# Patient Record
Sex: Male | Born: 1960 | Race: White | Hispanic: No | Marital: Married | Smoking: Never smoker
Health system: Southern US, Community
[De-identification: ages and names within clinical notes are randomized; demographics above are authoritative.]

## PROBLEM LIST (undated history)

## (undated) DIAGNOSIS — E782 Mixed hyperlipidemia: Secondary | ICD-10-CM

## (undated) DIAGNOSIS — I2109 ST elevation (STEMI) myocardial infarction involving other coronary artery of anterior wall: Principal | ICD-10-CM

## (undated) DIAGNOSIS — K5090 Crohn's disease, unspecified, without complications: Secondary | ICD-10-CM

## (undated) DIAGNOSIS — I2510 Atherosclerotic heart disease of native coronary artery without angina pectoris: Secondary | ICD-10-CM

## (undated) DIAGNOSIS — Z951 Presence of aortocoronary bypass graft: Secondary | ICD-10-CM

## (undated) DIAGNOSIS — Z9289 Personal history of other medical treatment: Secondary | ICD-10-CM

## (undated) HISTORY — PX: HERNIA REPAIR: SHX51

## (undated) HISTORY — DX: Personal history of other medical treatment: Z92.89

---

## 2012-12-21 ENCOUNTER — Encounter: Payer: Self-pay | Admitting: Pharmacist

## 2013-04-20 ENCOUNTER — Encounter: Payer: Self-pay | Admitting: Pharmacist

## 2013-06-19 ENCOUNTER — Encounter: Payer: Self-pay | Admitting: Pharmacist

## 2013-10-15 ENCOUNTER — Encounter (HOSPITAL_COMMUNITY)
Admission: EM | Disposition: A | Discharge status: Home / Self Care | Payer: BC Managed Care – PPO | Source: Home / Self Care | Attending: Thoracic Surgery (Cardiothoracic Vascular Surgery)

## 2013-10-15 ENCOUNTER — Emergency Department (HOSPITAL_BASED_OUTPATIENT_CLINIC_OR_DEPARTMENT_OTHER): Payer: BC Managed Care – PPO

## 2013-10-15 ENCOUNTER — Inpatient Hospital Stay (HOSPITAL_COMMUNITY): Payer: BC Managed Care – PPO

## 2013-10-15 ENCOUNTER — Encounter (HOSPITAL_COMMUNITY)
Admission: EM | Disposition: A | Discharge status: Home / Self Care | Payer: Self-pay | Source: Home / Self Care | Attending: Thoracic Surgery (Cardiothoracic Vascular Surgery)

## 2013-10-15 ENCOUNTER — Encounter (HOSPITAL_COMMUNITY): Payer: BC Managed Care – PPO | Admitting: Anesthesiology

## 2013-10-15 ENCOUNTER — Emergency Department (HOSPITAL_COMMUNITY): Payer: BC Managed Care – PPO

## 2013-10-15 ENCOUNTER — Inpatient Hospital Stay (HOSPITAL_BASED_OUTPATIENT_CLINIC_OR_DEPARTMENT_OTHER)
Admission: EM | Admit: 2013-10-15 | Discharge: 2013-10-19 | DRG: 232 | Disposition: A | Discharge status: Home / Self Care | Payer: BC Managed Care – PPO | Attending: Thoracic Surgery (Cardiothoracic Vascular Surgery) | Admitting: Thoracic Surgery (Cardiothoracic Vascular Surgery)

## 2013-10-15 ENCOUNTER — Ambulatory Visit (HOSPITAL_COMMUNITY): Admit: 2013-10-15 | Payer: Self-pay | Admitting: Interventional Cardiology

## 2013-10-15 ENCOUNTER — Encounter (HOSPITAL_BASED_OUTPATIENT_CLINIC_OR_DEPARTMENT_OTHER): Payer: Self-pay | Admitting: Emergency Medicine

## 2013-10-15 ENCOUNTER — Emergency Department (HOSPITAL_COMMUNITY): Payer: BC Managed Care – PPO | Admitting: Anesthesiology

## 2013-10-15 DIAGNOSIS — D62 Acute posthemorrhagic anemia: Secondary | ICD-10-CM | POA: Diagnosis not present

## 2013-10-15 DIAGNOSIS — Y838 Other surgical procedures as the cause of abnormal reaction of the patient, or of later complication, without mention of misadventure at the time of the procedure: Secondary | ICD-10-CM | POA: Diagnosis not present

## 2013-10-15 DIAGNOSIS — K5090 Crohn's disease, unspecified, without complications: Secondary | ICD-10-CM | POA: Insufficient documentation

## 2013-10-15 DIAGNOSIS — Z9861 Coronary angioplasty status: Secondary | ICD-10-CM

## 2013-10-15 DIAGNOSIS — Y9210 Unspecified residential institution as the place of occurrence of the external cause: Secondary | ICD-10-CM | POA: Diagnosis not present

## 2013-10-15 DIAGNOSIS — Z8249 Family history of ischemic heart disease and other diseases of the circulatory system: Secondary | ICD-10-CM

## 2013-10-15 DIAGNOSIS — E782 Mixed hyperlipidemia: Secondary | ICD-10-CM

## 2013-10-15 DIAGNOSIS — I213 ST elevation (STEMI) myocardial infarction of unspecified site: Secondary | ICD-10-CM

## 2013-10-15 DIAGNOSIS — J9819 Other pulmonary collapse: Secondary | ICD-10-CM | POA: Diagnosis not present

## 2013-10-15 DIAGNOSIS — I2510 Atherosclerotic heart disease of native coronary artery without angina pectoris: Secondary | ICD-10-CM

## 2013-10-15 DIAGNOSIS — Z951 Presence of aortocoronary bypass graft: Secondary | ICD-10-CM

## 2013-10-15 DIAGNOSIS — E119 Type 2 diabetes mellitus without complications: Secondary | ICD-10-CM | POA: Diagnosis present

## 2013-10-15 DIAGNOSIS — I2109 ST elevation (STEMI) myocardial infarction involving other coronary artery of anterior wall: Principal | ICD-10-CM

## 2013-10-15 DIAGNOSIS — J988 Other specified respiratory disorders: Secondary | ICD-10-CM | POA: Diagnosis not present

## 2013-10-15 HISTORY — DX: Presence of aortocoronary bypass graft: Z95.1

## 2013-10-15 HISTORY — DX: Crohn's disease, unspecified, without complications: K50.90

## 2013-10-15 HISTORY — PX: LEFT HEART CATH: SHX5478

## 2013-10-15 HISTORY — DX: ST elevation (STEMI) myocardial infarction involving other coronary artery of anterior wall: I21.09

## 2013-10-15 HISTORY — DX: Mixed hyperlipidemia: E78.2

## 2013-10-15 HISTORY — DX: Atherosclerotic heart disease of native coronary artery without angina pectoris: I25.10

## 2013-10-15 HISTORY — PX: TEE WITHOUT CARDIOVERSION: SHX5443

## 2013-10-15 HISTORY — PX: CORONARY ARTERY BYPASS GRAFT: SHX141

## 2013-10-15 LAB — CBC WITH DIFFERENTIAL/PLATELET
BASOS ABS: 0.1 10*3/uL (ref 0.0–0.1)
Basophils Relative: 1 % (ref 0–1)
EOS ABS: 0.5 10*3/uL (ref 0.0–0.7)
EOS PCT: 4 % (ref 0–5)
HCT: 45.6 % (ref 39.0–52.0)
Hemoglobin: 15.7 g/dL (ref 13.0–17.0)
Lymphocytes Relative: 36 % (ref 12–46)
Lymphs Abs: 4.6 10*3/uL — ABNORMAL HIGH (ref 0.7–4.0)
MCH: 30.7 pg (ref 26.0–34.0)
MCHC: 34.4 g/dL (ref 30.0–36.0)
MCV: 89.2 fL (ref 78.0–100.0)
Monocytes Absolute: 1.7 10*3/uL — ABNORMAL HIGH (ref 0.1–1.0)
Monocytes Relative: 13 % — ABNORMAL HIGH (ref 3–12)
Neutro Abs: 6.2 10*3/uL (ref 1.7–7.7)
Neutrophils Relative %: 47 % (ref 43–77)
Platelets: 356 10*3/uL (ref 150–400)
RBC: 5.11 MIL/uL (ref 4.22–5.81)
RDW: 12.8 % (ref 11.5–15.5)
WBC: 13 10*3/uL — AB (ref 4.0–10.5)

## 2013-10-15 LAB — BASIC METABOLIC PANEL
BUN: 14 mg/dL (ref 6–23)
CALCIUM: 9.8 mg/dL (ref 8.4–10.5)
CO2: 24 meq/L (ref 19–32)
CREATININE: 1.1 mg/dL (ref 0.50–1.35)
Chloride: 100 mEq/L (ref 96–112)
GFR calc Af Amer: 86 mL/min — ABNORMAL LOW (ref >90)
GFR, EST NON AFRICAN AMERICAN: 74 mL/min — AB (ref >90)
Glucose, Bld: 136 mg/dL — ABNORMAL HIGH (ref 70–99)
Potassium: 2.8 mEq/L — CL (ref 3.7–5.3)
Sodium: 140 mEq/L (ref 137–147)

## 2013-10-15 LAB — POCT ACTIVATED CLOTTING TIME
ACTIVATED CLOTTING TIME: 204 s
ACTIVATED CLOTTING TIME: 310 s
Activated Clotting Time: 110 seconds

## 2013-10-15 LAB — GLUCOSE, CAPILLARY
GLUCOSE-CAPILLARY: 92 mg/dL (ref 70–99)
GLUCOSE-CAPILLARY: 141 mg/dL — AB (ref 70–99)
GLUCOSE-CAPILLARY: 140 mg/dL — AB (ref 70–99)
GLUCOSE-CAPILLARY: 133 mg/dL — AB (ref 70–99)
Glucose-Capillary: 105 mg/dL — ABNORMAL HIGH (ref 70–99)
Glucose-Capillary: 105 mg/dL — ABNORMAL HIGH (ref 70–99)
Glucose-Capillary: 105 mg/dL — ABNORMAL HIGH (ref 70–99)
Glucose-Capillary: 119 mg/dL — ABNORMAL HIGH (ref 70–99)
Glucose-Capillary: 127 mg/dL — ABNORMAL HIGH (ref 70–99)
Glucose-Capillary: 162 mg/dL — ABNORMAL HIGH (ref 70–99)

## 2013-10-15 LAB — POCT I-STAT 3, ART BLOOD GAS (G3+)
ACID-BASE DEFICIT: 1 mmol/L (ref 0.0–2.0)
Acid-base deficit: 5 mmol/L — ABNORMAL HIGH (ref 0.0–2.0)
BICARBONATE: 25.5 meq/L — AB (ref 20.0–24.0)
Bicarbonate: 21.9 mEq/L (ref 20.0–24.0)
O2 SAT: 100 %
O2 SAT: 95 %
PCO2 ART: 44.1 mmHg (ref 35.0–45.0)
PH ART: 7.312 — AB (ref 7.350–7.450)
PH ART: 7.301 — AB (ref 7.350–7.450)
PO2 ART: 82 mmHg (ref 80.0–100.0)
Patient temperature: 36.4
TCO2: 27 mmol/L (ref 0–100)
TCO2: 23 mmol/L (ref 0–100)
pCO2 arterial: 50.5 mmHg — ABNORMAL HIGH (ref 35.0–45.0)
pO2, Arterial: 337 mmHg — ABNORMAL HIGH (ref 80.0–100.0)

## 2013-10-15 LAB — CBC
HCT: 32.2 % — ABNORMAL LOW (ref 39.0–52.0)
HEMATOCRIT: 37.3 % — AB (ref 39.0–52.0)
HEMATOCRIT: 34.7 % — AB (ref 39.0–52.0)
HEMOGLOBIN: 11.8 g/dL — AB (ref 13.0–17.0)
Hemoglobin: 12.9 g/dL — ABNORMAL LOW (ref 13.0–17.0)
Hemoglobin: 10.9 g/dL — ABNORMAL LOW (ref 13.0–17.0)
MCH: 30.4 pg (ref 26.0–34.0)
MCH: 29.9 pg (ref 26.0–34.0)
MCH: 30.1 pg (ref 26.0–34.0)
MCHC: 34.6 g/dL (ref 30.0–36.0)
MCHC: 33.9 g/dL (ref 30.0–36.0)
MCHC: 34 g/dL (ref 30.0–36.0)
MCV: 87.8 fL (ref 78.0–100.0)
MCV: 88.5 fL (ref 78.0–100.0)
MCV: 88.5 fL (ref 78.0–100.0)
PLATELETS: 227 10*3/uL (ref 150–400)
Platelets: 265 10*3/uL (ref 150–400)
Platelets: 245 10*3/uL (ref 150–400)
RBC: 4.25 MIL/uL (ref 4.22–5.81)
RBC: 3.64 MIL/uL — AB (ref 4.22–5.81)
RBC: 3.92 MIL/uL — AB (ref 4.22–5.81)
RDW: 12.6 % (ref 11.5–15.5)
RDW: 12.8 % (ref 11.5–15.5)
RDW: 13.1 % (ref 11.5–15.5)
WBC: 12.7 10*3/uL — AB (ref 4.0–10.5)
WBC: 21 10*3/uL — AB (ref 4.0–10.5)
WBC: 25.4 10*3/uL — AB (ref 4.0–10.5)

## 2013-10-15 LAB — POCT I-STAT 4, (NA,K, GLUC, HGB,HCT)
GLUCOSE: 127 mg/dL — AB (ref 70–99)
GLUCOSE: 111 mg/dL — AB (ref 70–99)
Glucose, Bld: 145 mg/dL — ABNORMAL HIGH (ref 70–99)
Glucose, Bld: 108 mg/dL — ABNORMAL HIGH (ref 70–99)
Glucose, Bld: 129 mg/dL — ABNORMAL HIGH (ref 70–99)
Glucose, Bld: 162 mg/dL — ABNORMAL HIGH (ref 70–99)
HCT: 33 % — ABNORMAL LOW (ref 39.0–52.0)
HCT: 26 % — ABNORMAL LOW (ref 39.0–52.0)
HCT: 32 % — ABNORMAL LOW (ref 39.0–52.0)
HEMATOCRIT: 37 % — AB (ref 39.0–52.0)
HEMATOCRIT: 26 % — AB (ref 39.0–52.0)
HEMATOCRIT: 26 % — AB (ref 39.0–52.0)
HEMOGLOBIN: 12.6 g/dL — AB (ref 13.0–17.0)
HEMOGLOBIN: 8.8 g/dL — AB (ref 13.0–17.0)
Hemoglobin: 11.2 g/dL — ABNORMAL LOW (ref 13.0–17.0)
Hemoglobin: 8.8 g/dL — ABNORMAL LOW (ref 13.0–17.0)
Hemoglobin: 8.8 g/dL — ABNORMAL LOW (ref 13.0–17.0)
Hemoglobin: 10.9 g/dL — ABNORMAL LOW (ref 13.0–17.0)
POTASSIUM: 5 meq/L (ref 3.7–5.3)
POTASSIUM: 6 meq/L — AB (ref 3.7–5.3)
Potassium: 4.4 mEq/L (ref 3.7–5.3)
Potassium: 5.1 mEq/L (ref 3.7–5.3)
Potassium: 4.7 mEq/L (ref 3.7–5.3)
Potassium: 4.2 mEq/L (ref 3.7–5.3)
SODIUM: 139 meq/L (ref 137–147)
SODIUM: 133 meq/L — AB (ref 137–147)
SODIUM: 137 meq/L (ref 137–147)
Sodium: 139 mEq/L (ref 137–147)
Sodium: 135 mEq/L — ABNORMAL LOW (ref 137–147)
Sodium: 139 mEq/L (ref 137–147)

## 2013-10-15 LAB — CK TOTAL AND CKMB (NOT AT ARMC)
CK TOTAL: 223 U/L (ref 7–232)
CK, MB: 24.4 ng/mL (ref 0.3–4.0)
RELATIVE INDEX: 10.9 — AB (ref 0.0–2.5)

## 2013-10-15 LAB — COMPREHENSIVE METABOLIC PANEL
ALT: 47 U/L (ref 0–53)
AST: 41 U/L — ABNORMAL HIGH (ref 0–37)
Albumin: 3.2 g/dL — ABNORMAL LOW (ref 3.5–5.2)
Alkaline Phosphatase: 113 U/L (ref 39–117)
BILIRUBIN TOTAL: 0.3 mg/dL (ref 0.3–1.2)
BUN: 14 mg/dL (ref 6–23)
CHLORIDE: 102 meq/L (ref 96–112)
CO2: 20 mEq/L (ref 19–32)
Calcium: 8.5 mg/dL (ref 8.4–10.5)
Creatinine, Ser: 0.97 mg/dL (ref 0.50–1.35)
GLUCOSE: 138 mg/dL — AB (ref 70–99)
Potassium: 4.2 mEq/L (ref 3.7–5.3)
Sodium: 135 mEq/L — ABNORMAL LOW (ref 137–147)
Total Protein: 6 g/dL (ref 6.0–8.3)

## 2013-10-15 LAB — HEMOGLOBIN A1C
Hgb A1c MFr Bld: 6.6 % — ABNORMAL HIGH (ref <5.7)
Mean Plasma Glucose: 143 mg/dL — ABNORMAL HIGH (ref <117)

## 2013-10-15 LAB — PROTIME-INR
INR: 0.85 (ref 0.00–1.49)
INR: 1.14 (ref 0.00–1.49)
INR: 1.26 (ref 0.00–1.49)
PROTHROMBIN TIME: 11.5 s — AB (ref 11.6–15.2)
PROTHROMBIN TIME: 15.5 s — AB (ref 11.6–15.2)
Prothrombin Time: 14.4 seconds (ref 11.6–15.2)

## 2013-10-15 LAB — LIPID PANEL
Cholesterol: 205 mg/dL — ABNORMAL HIGH (ref 0–200)
HDL: 27 mg/dL — ABNORMAL LOW (ref >39)
LDL CALC: 152 mg/dL — AB (ref 0–99)
Total CHOL/HDL Ratio: 7.6 RATIO
Triglycerides: 131 mg/dL (ref <150)
VLDL: 26 mg/dL (ref 0–40)

## 2013-10-15 LAB — CREATININE, SERUM: CREATININE: 0.95 mg/dL (ref 0.50–1.35)

## 2013-10-15 LAB — TROPONIN I: Troponin I: 5.14 ng/mL (ref <0.30)

## 2013-10-15 LAB — MAGNESIUM: Magnesium: 2.5 mg/dL (ref 1.5–2.5)

## 2013-10-15 LAB — APTT: aPTT: 33 seconds (ref 24–37)

## 2013-10-15 LAB — HEMOGLOBIN AND HEMATOCRIT, BLOOD
HCT: 27.2 % — ABNORMAL LOW (ref 39.0–52.0)
Hemoglobin: 9.3 g/dL — ABNORMAL LOW (ref 13.0–17.0)

## 2013-10-15 LAB — MRSA PCR SCREENING: MRSA BY PCR: NEGATIVE

## 2013-10-15 LAB — ABO/RH: ABO/RH(D): O NEG

## 2013-10-15 LAB — PLATELET COUNT: Platelets: 210 10*3/uL (ref 150–400)

## 2013-10-15 SURGERY — CORONARY ARTERY BYPASS GRAFTING (CABG)
Anesthesia: General | Site: Chest

## 2013-10-15 SURGERY — LEFT HEART CATH
Anesthesia: LOCAL

## 2013-10-15 MED ORDER — METOPROLOL TARTRATE 50 MG PO TABS: 25.0000 mg | ORAL_TABLET | Freq: Once | ORAL | Status: DC

## 2013-10-15 MED ORDER — VANCOMYCIN HCL 1000 MG IV SOLR
INTRAVENOUS | Status: DC
  Filled 2013-10-15: qty 1000

## 2013-10-15 MED ORDER — VANCOMYCIN HCL 10 G IV SOLR
1500.0000 mg | INTRAVENOUS | Status: DC
  Filled 2013-10-15: qty 1500

## 2013-10-15 MED ORDER — ONDANSETRON HCL 4 MG/2ML IJ SOLN
4.0000 mg | Freq: Four times a day (QID) | INTRAMUSCULAR | Status: DC | PRN
  Filled 2013-10-15: qty 2

## 2013-10-15 MED ORDER — NITROGLYCERIN IN D5W 200-5 MCG/ML-% IV SOLN
2.0000 ug/min | INTRAVENOUS | Status: DC
  Filled 2013-10-15: qty 250

## 2013-10-15 MED ORDER — SODIUM CHLORIDE 0.9 % IJ SOLN
OROMUCOSAL | Status: DC | PRN
  Administered 2013-10-15 (×5): via TOPICAL

## 2013-10-15 MED ORDER — GI COCKTAIL ~~LOC~~: 30.0000 mL | Freq: Once | ORAL | Status: DC

## 2013-10-15 MED ORDER — DOCUSATE SODIUM 100 MG PO CAPS
200.0000 mg | ORAL_CAPSULE | Freq: Every day | ORAL | Status: DC
Start: 2013-10-16 — End: 2013-10-19
  Administered 2013-10-16 – 2013-10-18 (×3): 200 mg via ORAL
  Filled 2013-10-15 (×4): qty 2

## 2013-10-15 MED ORDER — ASPIRIN 81 MG PO CHEW: 324.0000 mg | CHEWABLE_TABLET | Freq: Every day | ORAL | Status: DC

## 2013-10-15 MED ORDER — PROTAMINE SULFATE 10 MG/ML IV SOLN
INTRAVENOUS | Status: DC | PRN
  Administered 2013-10-15: 320 mg via INTRAVENOUS

## 2013-10-15 MED ORDER — MAGNESIUM SULFATE 4000MG/100ML IJ SOLN
4.0000 g | Freq: Once | INTRAMUSCULAR | Status: AC
Start: 2013-10-15 — End: 2013-10-15
  Administered 2013-10-15: 4 g via INTRAVENOUS
  Filled 2013-10-15: qty 100

## 2013-10-15 MED ORDER — HEPARIN (PORCINE) IN NACL 2-0.9 UNIT/ML-% IJ SOLN
INTRAMUSCULAR | Status: AC
  Filled 2013-10-15: qty 500

## 2013-10-15 MED ORDER — ALBUMIN HUMAN 5 % IV SOLN
INTRAVENOUS | Status: DC | PRN
  Administered 2013-10-15: 12:00:00 via INTRAVENOUS

## 2013-10-15 MED ORDER — SODIUM CHLORIDE 0.9 % IV SOLN
INTRAVENOUS | Status: DC
  Filled 2013-10-15: qty 40

## 2013-10-15 MED ORDER — DEXTROSE 5 % IV SOLN
0.0000 ug/min | INTRAVENOUS | Status: DC
  Administered 2013-10-15: 13.333 ug/min via INTRAVENOUS
  Filled 2013-10-15 (×2): qty 2

## 2013-10-15 MED ORDER — ACETAMINOPHEN 160 MG/5ML PO SOLN
650.0000 mg | Freq: Once | ORAL | Status: AC
Start: 2013-10-15 — End: 2013-10-15

## 2013-10-15 MED ORDER — NITROGLYCERIN IN D5W 200-5 MCG/ML-% IV SOLN
10.0000 ug/min | INTRAVENOUS | Status: DC
  Administered 2013-10-15: 5 ug/min via INTRAVENOUS

## 2013-10-15 MED ORDER — HEPARIN (PORCINE) IN NACL 2-0.9 UNIT/ML-% IJ SOLN
INTRAMUSCULAR | Status: AC
  Filled 2013-10-15: qty 1000

## 2013-10-15 MED ORDER — SODIUM CHLORIDE 0.9 % IV SOLN
250.0000 mL | INTRAVENOUS | Status: AC
  Administered 2013-10-15: 100 mL via INTRAVENOUS

## 2013-10-15 MED ORDER — DEXTROSE 5 % IV SOLN
0.5000 ug/min | INTRAVENOUS | Status: DC
  Filled 2013-10-15: qty 4

## 2013-10-15 MED ORDER — PHENYLEPHRINE HCL 10 MG/ML IJ SOLN
30.0000 ug/min | INTRAVENOUS | Status: DC
  Filled 2013-10-15: qty 2

## 2013-10-15 MED ORDER — BISACODYL 5 MG PO TBEC
10.0000 mg | DELAYED_RELEASE_TABLET | Freq: Every day | ORAL | Status: DC
  Administered 2013-10-16 – 2013-10-18 (×3): 10 mg via ORAL
  Filled 2013-10-15 (×3): qty 2

## 2013-10-15 MED ORDER — DEXTROSE 5 % IV SOLN
1.5000 g | Freq: Two times a day (BID) | INTRAVENOUS | Status: AC
  Administered 2013-10-15 – 2013-10-17 (×4): 1.5 g via INTRAVENOUS
  Filled 2013-10-15 (×4): qty 1.5

## 2013-10-15 MED ORDER — FENTANYL CITRATE 0.05 MG/ML IJ SOLN
INTRAMUSCULAR | Status: DC | PRN
  Administered 2013-10-15 (×2): 250 ug via INTRAVENOUS
  Administered 2013-10-15: 600 ug via INTRAVENOUS
  Administered 2013-10-15: 50 ug via INTRAVENOUS
  Administered 2013-10-15: 125 ug via INTRAVENOUS
  Administered 2013-10-15: 100 ug via INTRAVENOUS
  Administered 2013-10-15: 125 ug via INTRAVENOUS

## 2013-10-15 MED ORDER — LACTATED RINGERS IV SOLN
INTRAVENOUS | Status: DC | PRN
  Administered 2013-10-15 (×2): via INTRAVENOUS

## 2013-10-15 MED ORDER — LIDOCAINE HCL (PF) 1 % IJ SOLN
INTRAMUSCULAR | Status: AC
  Filled 2013-10-15: qty 30

## 2013-10-15 MED ORDER — ASPIRIN 81 MG PO CHEW
324.0000 mg | CHEWABLE_TABLET | Freq: Once | ORAL | Status: AC
  Administered 2013-10-15: 324 mg via ORAL
  Filled 2013-10-15: qty 4

## 2013-10-15 MED ORDER — MIDAZOLAM HCL 5 MG/5ML IJ SOLN
INTRAMUSCULAR | Status: DC | PRN
  Administered 2013-10-15: 3 mg via INTRAVENOUS
  Administered 2013-10-15: 1 mg via INTRAVENOUS
  Administered 2013-10-15: 2 mg via INTRAVENOUS

## 2013-10-15 MED ORDER — MIDAZOLAM HCL 2 MG/2ML IJ SOLN: 2.0000 mg | INTRAMUSCULAR | Status: DC | PRN

## 2013-10-15 MED ORDER — SODIUM CHLORIDE 0.9 % IV SOLN
INTRAVENOUS | Status: DC
  Filled 2013-10-15: qty 30

## 2013-10-15 MED ORDER — LACTATED RINGERS IV SOLN: INTRAVENOUS | Status: DC

## 2013-10-15 MED ORDER — SODIUM CHLORIDE 0.9 % IV SOLN
100.0000 [IU] | INTRAVENOUS | Status: DC | PRN
  Administered 2013-10-15: 2.6 [IU]/h via INTRAVENOUS

## 2013-10-15 MED ORDER — SODIUM CHLORIDE 0.9 % IV SOLN
INTRAVENOUS | Status: DC
  Filled 2013-10-15: qty 1

## 2013-10-15 MED ORDER — ACETAMINOPHEN 650 MG RE SUPP
650.0000 mg | Freq: Once | RECTAL | Status: AC
  Administered 2013-10-15: 650 mg via RECTAL

## 2013-10-15 MED ORDER — FAMOTIDINE IN NACL 20-0.9 MG/50ML-% IV SOLN
20.0000 mg | Freq: Two times a day (BID) | INTRAVENOUS | Status: AC
  Administered 2013-10-15 (×2): 20 mg via INTRAVENOUS
  Filled 2013-10-15: qty 50

## 2013-10-15 MED ORDER — ACETAMINOPHEN 500 MG PO TABS
1000.0000 mg | ORAL_TABLET | Freq: Four times a day (QID) | ORAL | Status: DC
  Administered 2013-10-15 – 2013-10-19 (×15): 1000 mg via ORAL
  Filled 2013-10-15 (×17): qty 2

## 2013-10-15 MED ORDER — SODIUM CHLORIDE 0.9 % IV SOLN
1000.0000 mg | INTRAVENOUS | Status: DC | PRN
  Administered 2013-10-15: 1500 mg via INTRAVENOUS

## 2013-10-15 MED ORDER — 0.9 % SODIUM CHLORIDE (POUR BTL) OPTIME
TOPICAL | Status: DC | PRN
  Administered 2013-10-15: 7000 mL

## 2013-10-15 MED ORDER — HEPARIN SODIUM (PORCINE) 5000 UNIT/ML IJ SOLN
INTRAMUSCULAR | Status: AC
  Administered 2013-10-15: 37000 [IU] via SUBCUTANEOUS
  Filled 2013-10-15: qty 1

## 2013-10-15 MED ORDER — NITROGLYCERIN IN D5W 200-5 MCG/ML-% IV SOLN
INTRAVENOUS | Status: AC
  Filled 2013-10-15: qty 250

## 2013-10-15 MED ORDER — POTASSIUM CHLORIDE 2 MEQ/ML IV SOLN
80.0000 meq | INTRAVENOUS | Status: DC
  Filled 2013-10-15: qty 40

## 2013-10-15 MED ORDER — PLASMA-LYTE 148 IV SOLN
INTRAVENOUS | Status: DC | PRN
  Administered 2013-10-15: 08:00:00 via INTRAVASCULAR

## 2013-10-15 MED ORDER — DEXTROSE 5 % IV SOLN
750.0000 mg | INTRAVENOUS | Status: DC
  Filled 2013-10-15: qty 750

## 2013-10-15 MED ORDER — SODIUM CHLORIDE 0.9 % IV SOLN: 1000.0000 mL | INTRAVENOUS | Status: DC

## 2013-10-15 MED ORDER — DEXTROSE 5 % IV SOLN
1.5000 g | INTRAVENOUS | Status: DC
  Filled 2013-10-15: qty 1.5

## 2013-10-15 MED ORDER — LACTATED RINGERS IV SOLN
INTRAVENOUS | Status: DC | PRN
  Administered 2013-10-15: 07:00:00 via INTRAVENOUS

## 2013-10-15 MED ORDER — MORPHINE SULFATE 2 MG/ML IJ SOLN
1.0000 mg | INTRAMUSCULAR | Status: AC | PRN
  Administered 2013-10-15: 2 mg via INTRAVENOUS
  Filled 2013-10-15: qty 1

## 2013-10-15 MED ORDER — MIDAZOLAM HCL 2 MG/2ML IJ SOLN
INTRAMUSCULAR | Status: AC
Start: 2013-10-15 — End: 2013-10-15
  Filled 2013-10-15: qty 2

## 2013-10-15 MED ORDER — ROCURONIUM BROMIDE 100 MG/10ML IV SOLN
INTRAVENOUS | Status: DC | PRN
  Administered 2013-10-15: 50 mg via INTRAVENOUS

## 2013-10-15 MED ORDER — NITROGLYCERIN 0.2 MG/ML ON CALL CATH LAB
INTRAVENOUS | Status: AC
  Filled 2013-10-15: qty 1

## 2013-10-15 MED ORDER — SODIUM CHLORIDE 0.9 % IV SOLN
INTRAVENOUS | Status: DC
  Administered 2013-10-16: 1.4 [IU]/h via INTRAVENOUS
  Filled 2013-10-15 (×2): qty 1

## 2013-10-15 MED ORDER — MAGNESIUM SULFATE 50 % IJ SOLN
40.0000 meq | INTRAMUSCULAR | Status: DC
  Filled 2013-10-15: qty 10

## 2013-10-15 MED ORDER — HEPARIN BOLUS VIA INFUSION: 4000.0000 [IU] | Freq: Once | INTRAVENOUS | Status: DC

## 2013-10-15 MED ORDER — PLASMA-LYTE 148 IV SOLN
INTRAVENOUS | Status: DC
  Filled 2013-10-15: qty 2.5

## 2013-10-15 MED ORDER — LACTATED RINGERS IV SOLN: 500.0000 mL | Freq: Once | INTRAVENOUS | Status: AC | PRN

## 2013-10-15 MED ORDER — SODIUM CHLORIDE 0.9 % IV SOLN
10.0000 g | INTRAVENOUS | Status: DC | PRN
  Administered 2013-10-15: 5 g/h via INTRAVENOUS

## 2013-10-15 MED ORDER — SODIUM CHLORIDE 0.9 % IV SOLN
INTRAVENOUS | Status: DC | PRN
  Administered 2013-10-15: 11:00:00 via INTRAVENOUS

## 2013-10-15 MED ORDER — TIROFIBAN HCL IV 5 MG/100ML
INTRAVENOUS | Status: AC
  Filled 2013-10-15: qty 100

## 2013-10-15 MED ORDER — HEPARIN SODIUM (PORCINE) 5000 UNIT/ML IJ SOLN: 60.0000 [IU]/kg | Freq: Once | INTRAMUSCULAR | Status: DC

## 2013-10-15 MED ORDER — CALCIUM CHLORIDE 10 % IV SOLN
INTRAVENOUS | Status: DC | PRN
  Administered 2013-10-15: 1 g via INTRAVENOUS

## 2013-10-15 MED ORDER — SUCCINYLCHOLINE CHLORIDE 20 MG/ML IJ SOLN
INTRAMUSCULAR | Status: DC | PRN
  Administered 2013-10-15: 140 mg via INTRAVENOUS

## 2013-10-15 MED ORDER — HEPARIN SODIUM (PORCINE) 1000 UNIT/ML IJ SOLN
INTRAMUSCULAR | Status: AC
Start: 2013-10-15 — End: 2013-10-15
  Filled 2013-10-15: qty 1

## 2013-10-15 MED ORDER — SODIUM CHLORIDE 0.9 % IV SOLN: 1000.0000 mL | Freq: Once | INTRAVENOUS | Status: DC

## 2013-10-15 MED ORDER — CEFUROXIME SODIUM 750 MG IJ SOLR
INTRAMUSCULAR | Status: DC | PRN
  Administered 2013-10-15: 750 mg via INTRAMUSCULAR

## 2013-10-15 MED ORDER — OXYCODONE HCL 5 MG PO TABS
5.0000 mg | ORAL_TABLET | ORAL | Status: DC | PRN
  Administered 2013-10-16 – 2013-10-17 (×4): 5 mg via ORAL
  Filled 2013-10-15 (×4): qty 1

## 2013-10-15 MED ORDER — HEPARIN SODIUM (PORCINE) 1000 UNIT/ML IJ SOLN
INTRAMUSCULAR | Status: DC | PRN
  Administered 2013-10-15: 3000 [IU] via INTRAVENOUS

## 2013-10-15 MED ORDER — DEXMEDETOMIDINE HCL IN NACL 200 MCG/50ML IV SOLN
INTRAVENOUS | Status: DC | PRN
  Administered 2013-10-15: 0.7 ug/kg/h via INTRAVENOUS

## 2013-10-15 MED ORDER — HEMOSTATIC AGENTS (NO CHARGE) OPTIME
TOPICAL | Status: DC | PRN
  Administered 2013-10-15: 1 via TOPICAL

## 2013-10-15 MED ORDER — ASPIRIN EC 325 MG PO TBEC
325.0000 mg | DELAYED_RELEASE_TABLET | Freq: Every day | ORAL | Status: DC
  Administered 2013-10-16 – 2013-10-19 (×4): 325 mg via ORAL
  Filled 2013-10-15 (×4): qty 1

## 2013-10-15 MED ORDER — VANCOMYCIN HCL IN DEXTROSE 1-5 GM/200ML-% IV SOLN
1000.0000 mg | Freq: Once | INTRAVENOUS | Status: AC
  Administered 2013-10-15: 1000 mg via INTRAVENOUS
  Filled 2013-10-15: qty 200

## 2013-10-15 MED ORDER — DEXMEDETOMIDINE HCL IN NACL 200 MCG/50ML IV SOLN
0.1000 ug/kg/h | INTRAVENOUS | Status: DC
Start: 2013-10-15 — End: 2013-10-16
  Administered 2013-10-15: 0.028 ug/kg/h via INTRAVENOUS
  Filled 2013-10-15: qty 50

## 2013-10-15 MED ORDER — NITROGLYCERIN 0.4 MG SL SUBL
SUBLINGUAL_TABLET | SUBLINGUAL | Status: AC
  Filled 2013-10-15: qty 1

## 2013-10-15 MED ORDER — DOPAMINE-DEXTROSE 3.2-5 MG/ML-% IV SOLN
2.0000 ug/kg/min | INTRAVENOUS | Status: DC
  Filled 2013-10-15: qty 250

## 2013-10-15 MED ORDER — ACETAMINOPHEN 160 MG/5ML PO SOLN: 1000.0000 mg | Freq: Four times a day (QID) | ORAL | Status: DC

## 2013-10-15 MED ORDER — SODIUM CHLORIDE 0.45 % IV SOLN
INTRAVENOUS | Status: DC
  Administered 2013-10-15: 13:00:00 via INTRAVENOUS

## 2013-10-15 MED ORDER — SODIUM CHLORIDE 0.9 % IJ SOLN
3.0000 mL | Freq: Two times a day (BID) | INTRAMUSCULAR | Status: DC
  Administered 2013-10-16 – 2013-10-17 (×2): 3 mL via INTRAVENOUS

## 2013-10-15 MED ORDER — VERAPAMIL HCL 2.5 MG/ML IV SOLN
INTRAVENOUS | Status: AC
  Filled 2013-10-15: qty 2

## 2013-10-15 MED ORDER — METOPROLOL TARTRATE 1 MG/ML IV SOLN: 2.5000 mg | INTRAVENOUS | Status: DC | PRN

## 2013-10-15 MED ORDER — VANCOMYCIN HCL 1000 MG IV SOLR
INTRAVENOUS | Status: DC | PRN
  Administered 2013-10-15: 08:00:00

## 2013-10-15 MED ORDER — NITROGLYCERIN 0.4 MG SL SUBL
0.4000 mg | SUBLINGUAL_TABLET | SUBLINGUAL | Status: DC | PRN
  Administered 2013-10-15: 0.4 mg via SUBLINGUAL
  Filled 2013-10-15: qty 25

## 2013-10-15 MED ORDER — INSULIN REGULAR BOLUS VIA INFUSION
0.0000 [IU] | Freq: Three times a day (TID) | INTRAVENOUS | Status: DC
Start: 2013-10-15 — End: 2013-10-18
  Filled 2013-10-15: qty 10

## 2013-10-15 MED ORDER — BISACODYL 10 MG RE SUPP: 10.0000 mg | Freq: Every day | RECTAL | Status: DC

## 2013-10-15 MED ORDER — FENTANYL CITRATE 0.05 MG/ML IJ SOLN
INTRAMUSCULAR | Status: AC
  Filled 2013-10-15: qty 2

## 2013-10-15 MED ORDER — NITROGLYCERIN 0.4 MG/SPRAY TL SOLN
Status: AC
  Filled 2013-10-15: qty 4.9

## 2013-10-15 MED ORDER — POTASSIUM CHLORIDE 10 MEQ/50ML IV SOLN: 10.0000 meq | INTRAVENOUS | Status: AC

## 2013-10-15 MED ORDER — METOPROLOL TARTRATE 25 MG/10 ML ORAL SUSPENSION
12.5000 mg | Freq: Two times a day (BID) | ORAL | Status: DC
  Filled 2013-10-15 (×3): qty 5

## 2013-10-15 MED ORDER — ATORVASTATIN CALCIUM 40 MG PO TABS
40.0000 mg | ORAL_TABLET | Freq: Every day | ORAL | Status: DC
  Filled 2013-10-15 (×2): qty 1

## 2013-10-15 MED ORDER — MORPHINE SULFATE 2 MG/ML IJ SOLN
2.0000 mg | INTRAMUSCULAR | Status: DC | PRN
Start: 2013-10-15 — End: 2013-10-16
  Administered 2013-10-15 (×3): 2 mg via INTRAVENOUS
  Administered 2013-10-16 (×3): 4 mg via INTRAVENOUS
  Filled 2013-10-15: qty 1
  Filled 2013-10-15 (×2): qty 2
  Filled 2013-10-15: qty 1
  Filled 2013-10-15: qty 2
  Filled 2013-10-15: qty 1

## 2013-10-15 MED ORDER — SODIUM CHLORIDE 0.9 % IV SOLN: INTRAVENOUS | Status: DC

## 2013-10-15 MED ORDER — PROPOFOL 10 MG/ML IV BOLUS
INTRAVENOUS | Status: DC | PRN
  Administered 2013-10-15: 50 mg via INTRAVENOUS

## 2013-10-15 MED ORDER — METOPROLOL TARTRATE 1 MG/ML IV SOLN
INTRAVENOUS | Status: AC
  Filled 2013-10-15: qty 5

## 2013-10-15 MED ORDER — VECURONIUM BROMIDE 10 MG IV SOLR
INTRAVENOUS | Status: DC | PRN
  Administered 2013-10-15: 4 mg via INTRAVENOUS
  Administered 2013-10-15: 6 mg via INTRAVENOUS
  Administered 2013-10-15: 3 mg via INTRAVENOUS

## 2013-10-15 MED ORDER — PANTOPRAZOLE SODIUM 40 MG PO TBEC
40.0000 mg | DELAYED_RELEASE_TABLET | Freq: Every day | ORAL | Status: DC
  Administered 2013-10-17 – 2013-10-18 (×2): 40 mg via ORAL
  Filled 2013-10-15 (×3): qty 1

## 2013-10-15 MED ORDER — METOPROLOL TARTRATE 12.5 MG HALF TABLET
12.5000 mg | ORAL_TABLET | Freq: Two times a day (BID) | ORAL | Status: DC
  Administered 2013-10-16 – 2013-10-19 (×6): 12.5 mg via ORAL
  Filled 2013-10-15 (×9): qty 1

## 2013-10-15 MED ORDER — NITROGLYCERIN IN D5W 200-5 MCG/ML-% IV SOLN: 0.0000 ug/min | INTRAVENOUS | Status: DC

## 2013-10-15 MED ORDER — DEXTROSE 5 % IV SOLN
1.5000 g | INTRAVENOUS | Status: DC | PRN
  Administered 2013-10-15: 1.5 g via INTRAVENOUS

## 2013-10-15 MED ORDER — PHENYLEPHRINE HCL 10 MG/ML IJ SOLN
20.0000 mg | INTRAVENOUS | Status: DC | PRN
  Administered 2013-10-15: 40 ug/min via INTRAVENOUS

## 2013-10-15 MED ORDER — SODIUM CHLORIDE 0.9 % IJ SOLN: 3.0000 mL | INTRAMUSCULAR | Status: DC | PRN

## 2013-10-15 MED ORDER — METOPROLOL TARTRATE 1 MG/ML IV SOLN
5.0000 mg | Freq: Once | INTRAVENOUS | Status: AC
  Administered 2013-10-15: 5 mg via INTRAVENOUS

## 2013-10-15 MED ORDER — ATROPINE SULFATE 1 MG/ML IJ SOLN
INTRAMUSCULAR | Status: DC | PRN
  Administered 2013-10-15: 0.4 mg via INTRAVENOUS

## 2013-10-15 MED ORDER — DEXMEDETOMIDINE HCL IN NACL 400 MCG/100ML IV SOLN
0.1000 ug/kg/h | INTRAVENOUS | Status: DC
  Filled 2013-10-15: qty 100

## 2013-10-15 MED ORDER — HEPARIN (PORCINE) IN NACL 100-0.45 UNIT/ML-% IJ SOLN: 1200.0000 [IU]/h | INTRAMUSCULAR | Status: DC

## 2013-10-15 MED ORDER — ALBUMIN HUMAN 5 % IV SOLN
250.0000 mL | INTRAVENOUS | Status: AC | PRN
  Administered 2013-10-15 (×2): 250 mL via INTRAVENOUS

## 2013-10-15 SURGICAL SUPPLY — 119 items
ADAPTER CARDIO PERF ANTE/RETRO (ADAPTER) IMPLANT
APPLIER CLIP 9.375 MED OPEN (MISCELLANEOUS)
APPLIER CLIP 9.375 SM OPEN (CLIP)
ATTRACTOMAT 16X20 MAGNETIC DRP (DRAPES) ×3 IMPLANT
BAG DECANTER FOR FLEXI CONT (MISCELLANEOUS) ×3 IMPLANT
BANDAGE ELASTIC 4 VELCRO ST LF (GAUZE/BANDAGES/DRESSINGS) ×6 IMPLANT
BANDAGE ELASTIC 6 VELCRO ST LF (GAUZE/BANDAGES/DRESSINGS) ×6 IMPLANT
BANDAGE GAUZE ELAST BULKY 4 IN (GAUZE/BANDAGES/DRESSINGS) ×6 IMPLANT
BASKET HEART (ORDER IN 25'S) (MISCELLANEOUS) ×1
BASKET HEART (ORDER IN 25S) (MISCELLANEOUS) ×2 IMPLANT
BENZOIN TINCTURE PRP APPL 2/3 (GAUZE/BANDAGES/DRESSINGS) ×3 IMPLANT
BLADE STERNUM SYSTEM 6 (BLADE) ×3 IMPLANT
BLADE SURG ROTATE 9660 (MISCELLANEOUS) ×3 IMPLANT
CANISTER SUCTION 2500CC (MISCELLANEOUS) ×3 IMPLANT
CANNULA EZ GLIDE AORTIC 21FR (CANNULA) ×3 IMPLANT
CANNULA GUNDRY RCSP 15FR (MISCELLANEOUS) ×3 IMPLANT
CANNULA VENOUS LOW PROF 34X46 (CANNULA) ×3 IMPLANT
CARDIAC SUCTION (MISCELLANEOUS) ×3 IMPLANT
CATH CPB KIT OWEN (MISCELLANEOUS) ×3 IMPLANT
CATH THORACIC 28FR (CATHETERS) IMPLANT
CATH THORACIC 28FR RT ANG (CATHETERS) IMPLANT
CATH THORACIC 36FR (CATHETERS) ×3 IMPLANT
CATH THORACIC 36FR RT ANG (CATHETERS) ×3 IMPLANT
CLIP APPLIE 9.375 MED OPEN (MISCELLANEOUS) IMPLANT
CLIP APPLIE 9.375 SM OPEN (CLIP) IMPLANT
CLIP FOGARTY SPRING 6M (CLIP) IMPLANT
CLIP RETRACTION 3.0MM CORONARY (MISCELLANEOUS) ×3 IMPLANT
CLIP TI MEDIUM 24 (CLIP) IMPLANT
CLIP TI WIDE RED SMALL 24 (CLIP) ×3 IMPLANT
CONN Y 3/8X3/8X3/8  BEN (MISCELLANEOUS)
CONN Y 3/8X3/8X3/8 BEN (MISCELLANEOUS) IMPLANT
COVER SURGICAL LIGHT HANDLE (MISCELLANEOUS) ×3 IMPLANT
CRADLE DONUT ADULT HEAD (MISCELLANEOUS) ×3 IMPLANT
DERMABOND ADVANCED (GAUZE/BANDAGES/DRESSINGS) ×1
DERMABOND ADVANCED .7 DNX12 (GAUZE/BANDAGES/DRESSINGS) ×2 IMPLANT
DRAIN CHANNEL 32F RND 10.7 FF (WOUND CARE) ×3 IMPLANT
DRAPE CARDIOVASCULAR INCISE (DRAPES) ×1
DRAPE SLUSH/WARMER DISC (DRAPES) ×3 IMPLANT
DRAPE SRG 135X102X78XABS (DRAPES) ×2 IMPLANT
DRSG COVADERM 4X14 (GAUZE/BANDAGES/DRESSINGS) ×3 IMPLANT
ELECT REM PT RETURN 9FT ADLT (ELECTROSURGICAL) ×6
ELECTRODE REM PT RTRN 9FT ADLT (ELECTROSURGICAL) ×4 IMPLANT
GLOVE BIO SURGEON STRL SZ 6 (GLOVE) ×9 IMPLANT
GLOVE BIO SURGEON STRL SZ 6.5 (GLOVE) ×9 IMPLANT
GLOVE BIO SURGEON STRL SZ7 (GLOVE) IMPLANT
GLOVE BIO SURGEON STRL SZ7.5 (GLOVE) ×6 IMPLANT
GLOVE BIOGEL PI IND STRL 6 (GLOVE) ×2 IMPLANT
GLOVE BIOGEL PI IND STRL 6.5 (GLOVE) IMPLANT
GLOVE BIOGEL PI IND STRL 7.0 (GLOVE) ×10 IMPLANT
GLOVE BIOGEL PI INDICATOR 6 (GLOVE) ×1
GLOVE BIOGEL PI INDICATOR 6.5 (GLOVE)
GLOVE BIOGEL PI INDICATOR 7.0 (GLOVE) ×5
GLOVE EUDERMIC 7 POWDERFREE (GLOVE) IMPLANT
GLOVE ORTHO TXT STRL SZ7.5 (GLOVE) ×6 IMPLANT
GOWN STRL REUS W/ TWL LRG LVL3 (GOWN DISPOSABLE) ×20 IMPLANT
GOWN STRL REUS W/TWL LRG LVL3 (GOWN DISPOSABLE) ×10
HEMOSTAT POWDER SURGIFOAM 1G (HEMOSTASIS) ×9 IMPLANT
INSERT FOGARTY 61MM (MISCELLANEOUS) IMPLANT
INSERT FOGARTY XLG (MISCELLANEOUS) ×3 IMPLANT
KIT BASIN OR (CUSTOM PROCEDURE TRAY) ×3 IMPLANT
KIT ROOM TURNOVER OR (KITS) ×3 IMPLANT
KIT SUCTION CATH 14FR (SUCTIONS) ×15 IMPLANT
KIT VASOVIEW W/TROCAR VH 2000 (KITS) ×3 IMPLANT
LEAD PACING MYOCARDI (MISCELLANEOUS) ×3 IMPLANT
MARKER GRAFT CORONARY BYPASS (MISCELLANEOUS) ×9 IMPLANT
NS IRRIG 1000ML POUR BTL (IV SOLUTION) ×15 IMPLANT
PACK OPEN HEART (CUSTOM PROCEDURE TRAY) ×3 IMPLANT
PAD ARMBOARD 7.5X6 YLW CONV (MISCELLANEOUS) ×3 IMPLANT
PAD ELECT DEFIB RADIOL ZOLL (MISCELLANEOUS) ×3 IMPLANT
PENCIL BUTTON HOLSTER BLD 10FT (ELECTRODE) ×3 IMPLANT
PUNCH AORTIC ROT 4.0MM RCL 40 (MISCELLANEOUS) ×3 IMPLANT
PUNCH AORTIC ROTATE 4.0MM (MISCELLANEOUS) IMPLANT
PUNCH AORTIC ROTATE 4.5MM 8IN (MISCELLANEOUS) IMPLANT
PUNCH AORTIC ROTATE 5MM 8IN (MISCELLANEOUS) IMPLANT
SET CARDIOPLEGIA MPS 5001102 (MISCELLANEOUS) ×6 IMPLANT
SOLUTION ANTI FOG 6CC (MISCELLANEOUS) IMPLANT
SPONGE GAUZE 4X4 12PLY (GAUZE/BANDAGES/DRESSINGS) ×9 IMPLANT
SPONGE LAP 18X18 X RAY DECT (DISPOSABLE) ×3 IMPLANT
SPONGE LAP 4X18 X RAY DECT (DISPOSABLE) IMPLANT
SUT BONE WAX W31G (SUTURE) ×3 IMPLANT
SUT ETHIBOND X763 2 0 SH 1 (SUTURE) ×6 IMPLANT
SUT MNCRL AB 3-0 PS2 18 (SUTURE) ×6 IMPLANT
SUT MNCRL AB 4-0 PS2 18 (SUTURE) IMPLANT
SUT PDS AB 1 CTX 36 (SUTURE) ×6 IMPLANT
SUT PROLENE 2 0 SH DA (SUTURE) IMPLANT
SUT PROLENE 3 0 SH DA (SUTURE) ×3 IMPLANT
SUT PROLENE 3 0 SH1 36 (SUTURE) IMPLANT
SUT PROLENE 4 0 RB 1 (SUTURE) ×2
SUT PROLENE 4 0 SH DA (SUTURE) ×3 IMPLANT
SUT PROLENE 4-0 RB1 .5 CRCL 36 (SUTURE) ×4 IMPLANT
SUT PROLENE 5 0 C 1 36 (SUTURE) IMPLANT
SUT PROLENE 6 0 C 1 30 (SUTURE) IMPLANT
SUT PROLENE 7 0 BV1 MDA (SUTURE) ×3 IMPLANT
SUT PROLENE 7.0 RB 3 (SUTURE) ×15 IMPLANT
SUT PROLENE 8 0 BV175 6 (SUTURE) ×6 IMPLANT
SUT PROLENE BLUE 7 0 (SUTURE) ×6 IMPLANT
SUT PROLENE POLY MONO (SUTURE) ×3 IMPLANT
SUT SILK  1 MH (SUTURE) ×2
SUT SILK 1 MH (SUTURE) ×4 IMPLANT
SUT STEEL 6MS V (SUTURE) IMPLANT
SUT STEEL STERNAL CCS#1 18IN (SUTURE) ×3 IMPLANT
SUT STEEL SZ 6 DBL 3X14 BALL (SUTURE) ×6 IMPLANT
SUT VIC AB 1 CTX 36 (SUTURE)
SUT VIC AB 1 CTX36XBRD ANBCTR (SUTURE) IMPLANT
SUT VIC AB 2-0 CT1 27 (SUTURE) ×1
SUT VIC AB 2-0 CT1 TAPERPNT 27 (SUTURE) ×2 IMPLANT
SUT VIC AB 2-0 CTX 27 (SUTURE) IMPLANT
SUT VIC AB 3-0 SH 27 (SUTURE)
SUT VIC AB 3-0 SH 27X BRD (SUTURE) IMPLANT
SUT VIC AB 3-0 X1 27 (SUTURE) ×6 IMPLANT
SUT VICRYL 4-0 PS2 18IN ABS (SUTURE) IMPLANT
SUTURE E-PAK OPEN HEART (SUTURE) ×3 IMPLANT
SYSTEM SAHARA CHEST DRAIN ATS (WOUND CARE) ×3 IMPLANT
TOWEL OR 17X24 6PK STRL BLUE (TOWEL DISPOSABLE) ×6 IMPLANT
TOWEL OR 17X26 10 PK STRL BLUE (TOWEL DISPOSABLE) ×6 IMPLANT
TRAY FOLEY IC TEMP SENS 16FR (CATHETERS) ×3 IMPLANT
TUBING INSUFFLATION 10FT LAP (TUBING) ×3 IMPLANT
UNDERPAD 30X30 INCONTINENT (UNDERPADS AND DIAPERS) ×3 IMPLANT
WATER STERILE IRR 1000ML POUR (IV SOLUTION) ×6 IMPLANT

## 2013-10-15 NOTE — Anesthesia Preprocedure Evaluation (Addendum)
Anesthesia Evaluation  Patient identified by MRN, date of birth, ID band Patient awake    Reviewed: Allergy & Precautions, H&P , NPO status , Patient's Chart, lab work & pertinent test results, reviewed documented beta blocker date and time   Airway Mallampati: I TM Distance: >3 FB Neck ROM: Full    Dental no notable dental hx.    Pulmonary neg pulmonary ROS,    Pulmonary exam normal       Cardiovascular + angina at rest + CAD and + Past MI negative cardio ROS   STEMI   Neuro/Psych negative neurological ROS  negative psych ROS   GI/Hepatic negative GI ROS, Neg liver ROS, GERD-  Medicated and Controlled,  Endo/Other  negative endocrine ROS  Renal/GU negative Renal ROS  negative genitourinary   Musculoskeletal   Abdominal   Peds  Hematology negative hematology ROS (+)   Anesthesia Other Findings   Reproductive/Obstetrics negative OB ROS                         Anesthesia Physical Anesthesia Plan  ASA: IV and emergent  Anesthesia Plan: General   Post-op Pain Management:    Induction: Intravenous  Airway Management Planned: Oral ETT  Additional Equipment: Arterial line, CVP, PA Cath, TEE and Ultrasound Guidance Line Placement  Intra-op Plan:   Post-operative Plan: Post-operative intubation/ventilation  Informed Consent: I have reviewed the patients History and Physical, chart, labs and discussed the procedure including the risks, benefits and alternatives for the proposed anesthesia with the patient or authorized representative who has indicated his/her understanding and acceptance.   Dental advisory given  Plan Discussed with: CRNA  Anesthesia Plan Comments:        Anesthesia Quick Evaluation

## 2013-10-15 NOTE — Brief Op Note (Addendum)
      [address]       [name] [address]             [phone]     10/15/2013  11:04 AM  PATIENT:  Kenneth Humphrey  53 y.o. male  PRE-OPERATIVE DIAGNOSIS:  acute myocardial infarction  POST-OPERATIVE DIAGNOSIS:  acute myocardial infarction  PROCEDURE:  Procedure(s): CORONARY ARTERY BYPASS GRAFTING (CABG)X4 LIMA-LAD; SEQ SVG-RAMUS-OM1; SVG-PDA TRANSESOPHAGEAL ECHOCARDIOGRAM (TEE) EVH LEFT LEG, RIGHT EVH EXPLORED AND FOUND TO BE SMALL  SURGEON:    [name], MD  ASSISTANTS:  Rowe Clack, PA-C  ANESTHESIA:   Arta Bruce, MD  CROSSCLAMP TIME:   9'  CARDIOPULMONARY BYPASS TIME: 95'  FINDINGS:  Normal LV function  Good quality left internal mammary conduit for grafting  Good quality saphenous vein conduit for grafting  Good quality target vessels for grafting  COMPLICATIONS: None  BASELINE WEIGHT: 99.7 kg  PATIENT DISPOSITION:   TO SICU IN STABLE CONDITION  [name] 10/15/2013 12:11 PM

## 2013-10-15 NOTE — Consult Note (Signed)
[address]       Kenneth Humphrey [address]             [phone]          CARDIOTHORACIC SURGERY CONSULTATION REPORT  PCP is Kenneth Lobo, MD Referring Provider is Kenneth Crafts, MD   Reason for consultation:  Severe 3-vessel CAD with acute ST-segment elevation myocardial infarction  HPI:  Patient is a 53 year old married white male with no previous cardiac history but risk factors notable for history of hyperlipidemia and a family history of coronary artery disease. The patient states that he has been feeling poorly for more than 6 months with symptoms of exertional fatigue and intermittent epigastric and substernal chest discomfort. Symptoms seem to be postprandial at times which caused the patient to blame possible hiatal hernia and GERD reflux disease. He was seen by his primary care physician and the gallbladder ultrasound was done but was unrevealing. Symptoms have continued over the past 6 months, and recently the patient has been having symptoms intermittently at night which wake him from his sleep. 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 EMS was summoned and baseline electrocardiogram revealed ST segment elevation across the anterior leads consistent with acute evolving ST segment elevation myocardial infarction. The patient was brought to the emergency department and taken directly to the cardiac Cath Lab by Dr. Eldridge Dace.  Catheterization reveals severe three-vessel coronary artery disease with subtotal occlusion of the proximal left anterior descending coronary artery. Primary balloon angioplasty was accomplished successfully, restoring normal antegrade flow with resolution of the patient's symptoms of chest pain. However, stent deployment was not feasible to do to the very proximal location and  complex anatomy associated with the lesion. An intra-aortic balloon pump was placed and emergent cardiac surgical consultation was requested. Upon my arrival in the cath lab the patient remains comfortable and pain-free with stable cardiac rhythm and hemodynamics.  The patient is married and lives with his wife in [place].  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 This does not require strenuous physical exertion. He has been healthy with exception of history of Crohn's disease which has been quiescent and treated only with diet. He describes more than 6 month history of progressive exertional fatigue. He has not had shortness of breath either with exertion or at rest. He denies any PND, orthopnea, or lower extremity edema. He has occasional palpitation without dizzy spell or syncope.  Past Medical History  Diagnosis Date  . Crohn disease     History reviewed. No pertinent past surgical history.  History reviewed. No pertinent family history.  History   Social History  . Marital Status: Married    Spouse Name: N/A    Number of Children: N/A  . Years of Education: N/A   Occupational History  . Not on file.   Social History Main Topics  . Smoking status: Never Smoker   . Smokeless tobacco: Never Used  . Alcohol Use: No  . Drug Use: No  . Sexual Activity: Yes   Other Topics Concern  . Not on file   Social History Narrative  . No narrative on file    Prior to Admission medications   Medication Sig Start Date End Date Taking? Authorizing Provider  atorvastatin (LIPITOR) 10 MG tablet Take 60  mg by mouth daily.   Yes Historical Provider, MD    Current Facility-Administered Medications  Medication Dose Route Frequency Provider Last Rate Last Dose  . [facility][MAR HOLD] 0.9 %  sodium chloride infusion  1,000 mL Intravenous Once April K Palumbo-Rasch, MD       Followed by  . 0.9 %  sodium chloride infusion  1,000 mL Intravenous Continuous April K Palumbo-Rasch, MD      .  aminocaproic acid (AMICAR) 10 g in sodium chloride 0.9 % 100 mL infusion   Intravenous To OR Purcell Nailslarence H Enis Leatherwood, MD      . cefUROXime (ZINACEF) 1.5 g in dextrose 5 % 50 mL IVPB  1.5 g Intravenous To OR Purcell Nailslarence H Grant Swager, MD      . cefUROXime (ZINACEF) 750 mg in dextrose 5 % 50 mL IVPB  750 mg Intravenous To OR Purcell Nailslarence H Keirah Konitzer, MD      . dexmedetomidine (PRECEDEX) 400 MCG/100ML infusion  0.1-0.7 mcg/kg/hr Intravenous To OR Purcell Nailslarence H Traci Gafford, MD      . DOPamine (INTROPIN) 800 mg in dextrose 5 % 250 mL infusion  2-20 mcg/kg/min Intravenous To OR Purcell Nailslarence H Naziya Hegwood, MD      . EPINEPHrine (ADRENALIN) 4,000 mcg in dextrose 5 % 250 mL infusion  0.5-20 mcg/min Intravenous To OR Purcell Nailslarence H Heather Mckendree, MD      . heparin 2,500 Units, papaverine 30 mg in electrolyte-148 (PLASMALYTE-148) 500 mL irrigation   Irrigation To OR Purcell Nailslarence H Xavien Dauphinais, MD      . heparin 30,000 units/NS 1000 mL solution for CELLSAVER   Other To OR Purcell Nailslarence H Mirranda Monrroy, MD      . heparin 5000 UNIT/ML injection           . heparin ADULT infusion 100 units/mL (25000 units/250 mL)  1,200 Units/hr Intravenous Continuous Abran DukeJames Ledford, RPH      . [facility][MAR HOLD] heparin bolus via infusion 4,000 Units  4,000 Units Intravenous Once Abran DukeJames Ledford, [facility]      . insulin regular (NOVOLIN R,HUMULIN R) 1 Units/mL in sodium chloride 0.9 % 100 mL infusion   Intravenous To OR Purcell Nailslarence H Yazlin Ekblad, MD      . magnesium sulfate (IV Push/IM) injection 40 mEq  40 mEq Other To OR Purcell Nailslarence H Jobeth Pangilinan, MD      . metoprolol (LOPRESSOR) 1 MG/ML injection           . nitroGLYCERIN (NITROSTAT) 0.4 MG SL tablet           . [facility][MAR HOLD] nitroGLYCERIN (NITROSTAT) SL tablet 0.4 mg  0.4 mg Sublingual Q5 min PRN April K Palumbo-Rasch, MD   0.4 mg at 10/15/13 0353  . [facility][MAR HOLD] nitroGLYCERIN 0.2 mg/mL in dextrose 5 % infusion  10-200 mcg/min Intravenous Titrated April K Palumbo-Rasch, MD      . nitroGLYCERIN 0.2 mg/mL in dextrose 5 % infusion           . nitroGLYCERIN 0.2 mg/mL in dextrose 5 % infusion  2-200  mcg/min Intravenous To OR Purcell Nailslarence H Francesco Provencal, MD      . phenylephrine (NEO-SYNEPHRINE) 20 mg in dextrose 5 % 250 mL infusion  30-200 mcg/min Intravenous To OR Purcell Nailslarence H Linna Thebeau, MD      . potassium chloride injection 80 mEq  80 mEq Other To OR Purcell Nailslarence H Jennie Hannay, MD      . vancomycin (VANCOCIN) 1,000 mg in sodium chloride 0.9 % 1,000 mL irrigation   Irrigation To OR Purcell Nailslarence H Pegeen Stiger, MD      .  vancomycin (VANCOCIN) 1,500 mg in sodium chloride 0.9 % 250 mL IVPB  1,500 mg Intravenous To OR [name], MD        No Known Allergies    Review of Systems:   General:  normal appetite, decreased energy, no weight gain, no weight loss, no fever  Cardiac:  + chest pain with exertion, + chest pain at rest, no SOB with exertion, no resting SOB, no PND, no orthopnea, + palpitations, no arrhythmia, no atrial fibrillation, no LE edema, no dizzy spells, no syncope  Respiratory:  no shortness of breath, no home oxygen, no productive cough, no dry cough, no bronchitis, no wheezing, no hemoptysis, no asthma, no pain with inspiration or cough, no sleep apnea, no CPAP at night  GI:   no difficulty swallowing, ? reflux, no frequent heartburn, + hiatal hernia, + abdominal pain, no constipation, + diarrhea, no hematochezia, no hematemesis, no melena  GU:   no dysuria,  no frequency, no urinary tract infection, no hematuria, no enlarged prostate, no kidney stones, no kidney disease  Vascular:  no pain suggestive of claudication, no pain in feet, no leg cramps, no varicose veins, no DVT, no non-healing foot ulcer  Neuro:   no stroke, no TIA's, no seizures, no headaches, no temporary blindness one eye,  no slurred speech, no peripheral neuropathy, no chronic pain, no instability of gait, no memory/cognitive dysfunction  Musculoskeletal: + 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, no joint swelling, no myalgias, no difficulty walking, normal mobility   Skin:   no rash, no itching, no skin infections, no pressure sores or  ulcerations  Psych:   no anxiety, no depression, no nervousness, no unusual recent stress  Eyes:   no blurry vision, + floaters, no recent vision changes, + wears glasses or contacts  ENT:   no hearing loss, no loose or painful teeth, no dentures, last saw dentist within 6 months  Hematologic:  no easy bruising, no abnormal bleeding, no clotting disorder, no frequent epistaxis  Endocrine:  no diabetes, does not check CBG's at home     Physical Exam:   BP 155/107  Pulse 126  Resp 24  Ht 6\' 4"  (1.93 m)  Wt 99.791 kg (220 lb)  BMI 26.79 kg/m2  SpO2 100%  General:    well-appearing  HEENT:  Unremarkable   Neck:   no JVD, no bruits, no adenopathy   Chest:   clear to auscultation, symmetrical breath sounds, no wheezes, no rhonchi   CV:   RRR, no murmur   Abdomen:  soft, non-tender, no masses   Extremities:  IABP in place via sheath in right femoral artery, warm, well-perfused, pulses palpable, no lower extremity edema  Rectal/GU  Deferred  Neuro:   Grossly non-focal and symmetrical throughout  Skin:   Clean and dry, no rashes, no breakdown  Diagnostic Tests:  CARDIAC CATHETERIZATION  PROCEDURE:  Left heart catheterization with selective coronary angiography, left ventriculogram.  PCI LAD, intra-aortic balloon pump placement  INDICATIONS:  Anterior ST elevation MI  The risks, benefits, and details of the procedure were explained to the patient.  The patient verbalized understanding and wanted to proceed.  Informed written consent was obtained.  PROCEDURE TECHNIQUE:  After Xylocaine anesthesia a 4F slender sheath was placed in the right radial artery with a single anterior needle wall stick.   IV heparin was given. Right coronary angiography was done using a Judkins R4 guide catheter.  Left coronary angiography was done using a CLS 3.0 guide catheter.  Left ventriculography was done using a pigtail catheter.  The radial sheath was sewn in to be used as an arterial line in the operating  room.  The right groin was prepped in sterile fashion. An intra-aortic balloon pump was placed under fluoroscopic guidance. This was placed on 1:1.      CONTRAST:  Total of 180 cc.  COMPLICATIONS:  None.    HEMODYNAMICS:  Aortic pressure was 111/70; LV pressure was 118/9; LVEDP 14.  There was no gradient between the left ventricle and aorta.    ANGIOGRAPHIC DATA:   The left main coronary artery is a large vessel and patent proximally. There is mild plaque in the distal left main which extends into the proximal portion of both the LAD and circumflex.  The left anterior descending artery is a large vessel. The ostial to proximal vessel is heavily diseased. There is a large first diagonal which branches across the lateral wall. The LAD is subtotally occluded just after the origin of this large first diagonal. There is visible thrombus in this portion of the LAD. There is TIMI 1 flow in the LAD. After the angioplasty was performed, it was noted that there are several small diagonals which are patent. There is one medium-sized diagonal which is also patent the likely too small to graft.  The left circumflex artery is a large vessel. There is a ostial to proximal 80% stenosis. In the mid circumflex there is more severe disease, up to 90%. There is an additional 80% blockage before a large first obtuse marginal. The obtuse marginal is patent.  The right coronary artery is a large dominant vessel. There is a proximal 80% stenosis, with moderate diffuse disease just past the proximal segment. The posterior descending artery is widely patent. Posterior lateral artery is a large vessel and widely patent. There are left to right collaterals filling the LAD.  LEFT VENTRICULOGRAM:  Left ventricular angiogram was done in the 30 RAO projection and revealed moderately decreased  systolic function with mid to distal anterior and apical hypokinesis of severe degree ; with an estimated ejection fraction of 30%.   LVEDP was 14 mmHg.  PCI NARRATIVE: Given that he had multivessel disease at areas of bifurcation including the proximal to ostial LAD, proximal to ostial circumflex, and large diagonal vessel taking off in this area, we felt that bypass surgery would be optimal. The strategy was to try and restore flow into the LAD with balloon was and then have the patient go for bypass surgery. Dr. Cornelius Humphrey was consulted.    A CLS 3.0 guiding catheters used. IV heparin was used for anticoagulation. IV tirofiban was also used. ACT was used to check that the heparin was therapeutic. A pro-water wire was placed into the large diagonal vessels. It was used to stabilize the guide. A BMW wire was advanced to the LAD but was unsuccessful in entering the thrombotic LAD occlusion. A Fielder XT wire was then advanced which did successfully navigated into the LAD. A 2.0 x 12 balloon was used to predilate the lesion. Ballooning in the mid LAD did not significantly improve flow. TIMI-3 flow was restored. We then advanced the balloon further down in the mid LAD and did multiple balloon inflations. After that, there is flow restored in the LAD. A 2.5 balloon was then advanced and several balloon inflations were performed.  There was no clear dissection visible. It was unclear how long it would take for him to be in the operating room. Therefore, the  right groin was prepped and intra-aortic balloon pump was placed under fluoroscopic guidance as noted above. A 2.75 x 12 balloon was advanced to the LAD and another balloon dilatation was performed to try to maximize diameter prior to bypass. We debated whether the guide and wire should be left in place. Since the guide would sometimes go deep into the vessel and damp, we elected to pull the guidewire. TIMI-3 flow was maintained. The cardiac surgeon came in and spoke to the patient. He was then taken to the operating room from the Cath Lab.  IMPRESSIONS:    1. Patent left main coronary  artery. 2. Subtotally occluded, thrombotic proximal left anterior descending artery.  This was successfully treated with serial balloon dilatation. The largest balloon used was a 2.75 x 12 balloon. TIMI-3 flow was restored in the LAD. 3. Severe disease in the proximal and mid left circumflex artery.  There is a large OM1 which appears to be a suitable target for bypass graft. 4. Severely diseased ostial to proximal right coronary artery.  There is moderate disease in the mid vessel. The posterior descending and posterior lateral arteries are patent. 5. Moderately decreased left ventricular systolic function.  LVEDP 14 mmHg.  Ejection fraction 30 %. 6.   Intra-aortic balloon pump placed from the right groin. 7.   Planned CABG post POBA based on severe 3 vessel disease.  RECOMMENDATION:  The patient will be going for bypass surgery with Dr. Cornelius Humphrey, likely four-vessel bypass- after successful POBA of the LAD.  He'll need aggressive secondary prevention postoperatively.   He lives [address] of [place]. Ultimately, he may choose to find followup cardiology care closer to home. We will be happy to follow him in [place] if that is what he chooses.    Impression:  Critical three-vessel coronary artery disease with acute ST segment elevation myocardial infarction involving ostial and proximal left anterior descending coronary artery stenosis which has anatomical location and features unfavorable for definitive PCI and stenting. The patient has undergone primary balloon angioplasty with successful restoration of normal antegrade flow and resolution of acute ischemia. However, his anatomy appears unstable with considerable risk for possible reocclusion in the immediate future. I favor proceeding directly to the operating room for emergent surgical revascularization.   Plan:  I've reviewed the indications, risks, and potential benefits of surgery with the patient and his wife.  Alternative treatment  strategies have been discussed.  The patient understands and accepts all potential associated risks of surgery including but not limited to risk of death, stroke or other neurologic complication, myocardial infarction, congestive heart failure, respiratory failure, renal failure, bleeding requiring blood transfusion and/or reexploration, aortic dissection or other major vascular complication, arrhythmia, heart block or bradycardia requiring permanent pacemaker, pneumonia, pleural effusion, wound infection, pulmonary embolus or other thromboembolic complication, chronic pain or other delayed complications related to median sternotomy, or the late recurrence of symptomatic ischemic heart disease and/or congestive heart failure.  The importance of long term risk modification have been emphasized.  All questions answered.   I spent in excess of 90 minutes during the conduct of this hospital consultation and >50% of this time involved direct face-to-face encounter for counseling and/or coordination of the patient's care.   Kenneth Decent. Cornelius Moras, MD 10/15/2013 6:11am

## 2013-10-15 NOTE — Op Note (Addendum)
CARDIOTHORACIC SURGERY OPERATIVE NOTE  Date of Procedure: 10/15/2013  Preoperative Diagnosis: Severe 3-vessel Coronary Artery Disease  Postoperative Diagnosis: Same  Procedure:    Emergency Coronary Artery Bypass Grafting x 4   Left Internal Mammary Artery to Distal Left Anterior Descending Coronary Artery  Saphenous Vein Graft to Posterior Descending Coronary Artery  Saphenous Vein Graft to Ramus Intermediate Branch Coronary Artery and  Sequential Saphenous Vein Graft to Obtuse Marginal Branch of Left Circumflex Coronary Artery  Endoscopic Vein Harvest from Left Thigh and Lower Leg  Surgeon: Salvatore Decent. Cornelius Moras, MD  Assistant: Rowe Clack, PA-C  Anesthesia: Arta Bruce, MD  Operative Findings: Normal LV function  Good quality left internal mammary conduit for grafting  Good quality saphenous vein conduit for grafting  Good quality target vessels for grafting     BRIEF CLINICAL NOTE AND INDICATIONS FOR SURGERY   Patient is a 53 year old married white male with no previous cardiac history but risk factors notable for history of hyperlipidemia and a family history of coronary artery disease. The patient states that he has been feeling poorly for more than 6 months with symptoms of exertional fatigue and intermittent epigastric and substernal chest discomfort. Symptoms seem to be postprandial at times which caused the patient to blame possible hiatal hernia and GERD reflux disease. He was seen by his primary care physician and the gallbladder ultrasound was done but was unrevealing. Symptoms have continued over the past 6 months, and recently the patient has been having symptoms intermittently at night which wake him from his sleep. 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 EMS was summoned and baseline electrocardiogram  revealed ST segment elevation across the anterior leads consistent with acute evolving ST segment elevation myocardial infarction. The patient was brought to the emergency department and taken directly to the cardiac Cath Lab by Dr. Eldridge Dace. Catheterization reveals severe three-vessel coronary artery disease with subtotal occlusion of the proximal left anterior descending coronary artery. Primary balloon angioplasty was accomplished successfully, restoring normal antegrade flow with resolution of the patient's symptoms of chest pain. However, stent deployment was not feasible to do to the very proximal location and complex anatomy associated with the lesion. An intra-aortic balloon pump was placed and emergent cardiac surgical consultation was requested. Upon my arrival in the cath lab the patient remains comfortable and pain-free with stable cardiac rhythm and hemodynamics.  The patient and his wife have been seen in consultation in the cath labe and counseled at length regarding the indications, risks and potential benefits of surgery.  All questions have been answered, and the patient provides full informed consent for the operation as described.  The patient is brought directly from the cath lab to the operating room for emergency revascularization.    DETAILS OF THE OPERATIVE PROCEDURE  Preparation:  The patient is brought to the operating room on the above mentioned date and central monitoring was established by the anesthesia team including placement of Swan-Ganz catheter and radial arterial line. The patient is placed in the supine position on the operating table.  Intravenous antibiotics are administered. General endotracheal anesthesia is induced uneventfully. A Foley catheter is placed.  Baseline transesophageal echocardiogram was performed.  Findings were notable for normal LV function.  The patient's chest, abdomen, both groins, and both lower extremities are prepared and draped in a  sterile manner.  A time out procedure is performed.   Surgical Approach and Conduit Harvest:  A median sternotomy incision was performed and the left internal mammary artery is dissected from the chest wall and prepared for bypass grafting. The left internal mammary artery is notably good quality conduit. Simultaneously, saphenous vein is obtained from the patient's left thigh and leg using endoscopic vein harvest technique. Initially an incision is made in the right lower extremity but the vein appeared somewhat small.  The saphenous vein removed from the left side is notably good quality conduit. After removal of the saphenous vein, the small surgical incisions in the lower extremity are closed with absorbable suture. Following systemic heparinization, the left internal mammary artery was transected distally noted to have excellent flow.   Extracorporeal Cardiopulmonary Bypass and Myocardial Protection:  The pericardium is opened. The ascending aorta is normal in appearance. The ascending aorta and the right atrium are cannulated for cardioplegia bypass.  Adequate heparinization is verified.    A retrograde cardioplegia cannula is placed through the right atrium into the coronary sinus.  The entire pre-bypass portion of the operation was notable for stable hemodynamics.  Cardiopulmonary bypass was begun and the surface of the heart is inspected. Distal target vessels are selected for coronary artery bypass grafting. A cardioplegia cannula is placed in the ascending aorta.  A temperature probe was placed in the interventricular septum.  The patient is allowed to cool passively to [facility]32C systemic temperature.  The aortic cross clamp is applied and cold blood cardioplegia is delivered initially in an antegrade fashion through the aortic root.   Supplemental cardioplegia is given retrograde through the coronary sinus catheter.  Iced saline slush is applied for topical hypothermia.  The initial  cardioplegic arrest is rapid with early diastolic arrest.  Repeat doses of cardioplegia are administered intermittently throughout the entire cross clamp portion of the operation through the aortic root, through the coronary sinus catheter, and through subsequently placed vein grafts in order to maintain completely flat electrocardiogram and septal myocardial temperature below 15C.  Myocardial protection was felt to be excellent.  Coronary Artery Bypass Grafting:   The posterior descending branch of the right coronary artery was grafted using a reversed saphenous vein graft in an end-to-side fashion.  At the site of distal anastomosis the target vessel was good quality and measured approximately 2.0 mm in diameter.  The ramus intermediate branch coronary artery was grafted using a reversed saphenous vein graft in an end-to-side fashion.  At the site of distal anastomosis the target vessel was good quality and measured approximately 1.8 mm in diameter.  The obtuse marginal branch of the left circumflex coronary artery was grafted in and end-to-side fashion using a sequential saphenous vein graft off of the distal segment of the vein graft placed to the ramus intermediate branch coronary artery.  At the site of distal anastomosis the target vessel was good quality and measured approximately 2.0 mm in diameter.  The distal left anterior coronary artery was grafted with the left internal mammary artery in an end-to-side fashion.  At the site of distal anastomosis the target vessel was good quality and measured approximately 2.0 mm in diameter.  All proximal vein graft anastomoses were placed directly to the ascending aorta prior to removal of the aortic cross clamp.  The septal myocardial temperature rose rapidly after reperfusion of the left internal mammary artery graft.  The aortic cross clamp was removed after a total cross clamp time of 81 minutes.   Procedure  Completion:  All proximal and distal  coronary anastomoses were inspected for hemostasis and appropriate graft orientation. Epicardial pacing wires are fixed to the right ventricular outflow tract and to the right atrial appendage. The patient is rewarmed to 37C temperature. The patient is weaned and disconnected from cardiopulmonary bypass.  The patient's rhythm at separation from bypass was sinus.  The patient was weaned from cardioplegic bypass without any inotropic support.  Intraaortic balloon pump counterpulsation is resumed. Total cardiopulmonary bypass time for the operation was 95 minutes.  Followup transesophageal echocardiogram performed after separation from bypass revealed no changes from the preoperative exam.  The aortic and venous cannula were removed uneventfully. Protamine was administered to reverse the anticoagulation. The mediastinum and pleural space were inspected for hemostasis and irrigated with saline solution. The mediastinum and the left pleural space were drained using 3 chest tubes placed through separate stab incisions inferiorly.  The soft tissues anterior to the aorta were reapproximated loosely. The sternum is closed with double strength sternal wire. The soft tissues anterior to the sternum were closed in multiple layers and the skin is closed with a running subcuticular skin closure.  The post-bypass portion of the operation was notable for stable rhythm and hemodynamics.  No blood products were administered during the operation.  The intraaortic balloon pump was pulled back 2 cm because the pressure line was obstructed.   Disposition:  The patient tolerated the procedure well and is transported to the surgical intensive care in stable condition. There are no intraoperative complications. All sponge instrument and needle counts are verified correct at completion of the operation.    Salvatore Decent. Cornelius Moras MD 10/15/2013 12:12 PM

## 2013-10-15 NOTE — ED Notes (Signed)
Pt to ED with c/o mid chest epigastric pain non radiating onset x 1 hr

## 2013-10-15 NOTE — Progress Notes (Signed)
ANTICOAGULATION CONSULT NOTE - Initial Consult  Pharmacy Consult for Heparin  Indication: chest pain/ACS  No Known Allergies  Patient Measurements: Height: 6\' 4"  (193 cm) Weight: 220 lb (99.791 kg) IBW/kg (Calculated) : 86.8  Vital Signs: BP: 155/100 mmHg (04/19 0344) Pulse Rate: 143 (04/19 0344)  Labs:  Recent Labs  10/15/13 0345  HGB 15.7  HCT 45.6  PLT 356   Assessment: 53 y/o M at [facility], CODE STEMI with plans to transfer to [facility], labs as above.   Goal of Therapy:  Heparin level 0.3-0.7 units/ml Monitor platelets by anticoagulation protocol: Yes   Plan:  -Heparin 4000 units BOLUS -Start heparin infusion at 1200 units/hr -F/U plans for heparin after arrival at [facility] 10/15/2013,3:58 AM

## 2013-10-15 NOTE — CV Procedure (Addendum)
PROCEDURE:  Left heart catheterization with selective coronary angiography, left ventriculogram.  PCI LAD, intra-aortic balloon pump placement  INDICATIONS:  Anterior ST elevation MI  The risks, benefits, and details of the procedure were explained to the patient.  The patient verbalized understanding and wanted to proceed.  Informed written consent was obtained.  PROCEDURE TECHNIQUE:  After Xylocaine anesthesia a 58F slender sheath was placed in the right radial artery with a single anterior needle wall stick.   IV heparin was given. Right coronary angiography was done using a Judkins R4 guide catheter.  Left coronary angiography was done using a CLS 3.0 guide catheter.  Left ventriculography was done using a pigtail catheter.  The radial sheath was sewn in to be used as an arterial line in the operating room.  The right groin was prepped in sterile fashion. An intra-aortic balloon pump was placed under fluoroscopic guidance. This was placed on 1:1.     CONTRAST:  Total of 180 cc.  COMPLICATIONS:  None.    HEMODYNAMICS:  Aortic pressure was 111/70; LV pressure was 118/9; LVEDP 14.  There was no gradient between the left ventricle and aorta.    ANGIOGRAPHIC DATA:   The left main coronary artery is a large vessel and patent proximally. There is mild plaque in the distal left main which extends into the proximal portion of both the LAD and circumflex.  The left anterior descending artery is a large vessel. The ostial to proximal vessel is heavily diseased. There is a large first diagonal which branches across the lateral wall. The LAD is subtotally occluded just after the origin of this large first diagonal. There is visible thrombus in this portion of the LAD. There is TIMI 1 flow in the LAD. After the angioplasty was performed, it was noted that there are several small diagonals which are patent. There is one medium-sized diagonal which is also patent the likely too small to graft.  The  left circumflex artery is a large vessel. There is a ostial to proximal 80% stenosis. In the mid circumflex there is more severe disease, up to 90%. There is an additional 80% blockage before a large first obtuse marginal. The obtuse marginal is patent.  The right coronary artery is a large dominant vessel. There is a proximal 80% stenosis, with moderate diffuse disease just past the proximal segment. The posterior descending artery is widely patent. Posterior lateral artery is a large vessel and widely patent. There are left to right collaterals filling the LAD.  LEFT VENTRICULOGRAM:  Left ventricular angiogram was done in the 30 RAO projection and revealed moderately decreased  systolic function with mid to distal anterior and apical hypokinesis of severe degree ; with an estimated ejection fraction of 30%.  LVEDP was 14 mmHg.  PCI NARRATIVE: Given that he had multivessel disease at areas of bifurcation including the proximal to ostial LAD, proximal to ostial circumflex, and large diagonal vessel taking off in this area, we felt that bypass surgery would be optimal. The strategy was to try and restore flow into the LAD with balloon was and then have the patient go for bypass surgery. Dr. Cornelius Moraswen was consulted.    A CLS 3.0 guiding catheters used. IV heparin was used for anticoagulation. IV tirofiban was also used. ACT was used to check that the heparin was therapeutic. A pro-water wire was placed into the large diagonal vessels. It was used to stabilize the guide. A BMW wire was advanced to  the LAD but was unsuccessful in entering the thrombotic LAD occlusion. A Fielder XT wire was then advanced which did successfully navigated into the LAD. A 2.0 x 12 balloon was used to predilate the lesion. Ballooning in the mid LAD did not significantly improve flow. TIMI-3 flow was restored. We then advanced the balloon further down in the mid LAD and did multiple balloon inflations. After that, there is flow restored  in the LAD. A 2.5 balloon was then advanced and several balloon inflations were performed.  There was no clear dissection visible. It was unclear how long it would take for him to be in the operating room. Therefore, the right groin was prepped and intra-aortic balloon pump was placed under fluoroscopic guidance as noted above. A 2.75 x 12 balloon was advanced to the LAD and another balloon dilatation was performed to try to maximize diameter prior to bypass. We debated whether the guide and wire should be left in place. Since the guide would sometimes go deep into the vessel and damp, we elected to pull the guidewire. TIMI-3 flow was maintained. The cardiac surgeon came in and spoke to the patient. He was then taken to the operating room from the Cath Lab.  IMPRESSIONS:  1. Patent left main coronary artery. 2. Subtotally occluded, thrombotic proximal left anterior descending artery.  This was successfully treated with serial balloon dilatation. The largest balloon used was a 2.75 x 12 balloon. TIMI-3 flow was restored in the LAD. 3. Severe disease in the proximal and mid left circumflex artery.  There is a large OM1 which appears to be a suitable target for bypass graft. 4. Severely diseased ostial to proximal right coronary artery.  There is moderate disease in the mid vessel. The posterior descending and posterior lateral arteries are patent. 5. Moderately decreased left ventricular systolic function.  LVEDP 14 mmHg.  Ejection fraction 30 %. 6.   Intra-aortic balloon pump placed from the right groin. 7.   Planned CABG post POBA based on severe 3 vessel disease.  RECOMMENDATION:  The patient will be going for bypass surgery with Dr. Cornelius Moras, likely four-vessel bypass- after successful POBA of the LAD.  He'll need aggressive secondary prevention postoperatively.   He lives [address] of [place]. Ultimately, he may choose to find followup cardiology care closer to home. We will be happy to follow him in  [place] if that is what he chooses.

## 2013-10-15 NOTE — H&P (Signed)
Admit date: 10/15/2013 Referring Physician Dr. Daun Peacock Primary Cardiologist Dr. Irving Shows Chief complaint/reason for admission: Chest pain  HPI: 52 history of hyperlipidemia, Crohn's disease and a family history of early coronary artery disease. He has been having some chest discomfort over the past 3 months intermittently. He underwent gallbladder scan which was negative. He thought was some type of GI related discomfort. Today, he was at work and began having severe substernal chest discomfort. He immediately told his supervisor at work. He was taken med [facility] where ECG revealed anterior ST elevation. Code STEMI was called and he was transferred to [facility]. He had episodes of tachycardia while at the med [facility]. He received a heparin bolus and nitroglycerin. After the nitroglycerin, his pain did get better.  Upon arrival to [facility], he is having 1/10 chest discomfort. It was improved from what he had earlier. He denied any significant bleeding problems. No upcoming surgery.  His potassium was low. He was given 40 mEq oral potassium liquid upon arrival before the start of the catheterization.    PMH:    Past Medical History  Diagnosis Date  . Crohn disease   . Acute myocardial infarction of other anterior wall, initial episode of care     PSH:   History reviewed. No pertinent past surgical history.  ALLERGIES:   Review of patient's allergies indicates no known allergies.  Prior to Admit Meds:   Prescriptions prior to admission  Medication Sig Dispense Refill  . atorvastatin (LIPITOR) 10 MG tablet Take 60 mg by mouth daily.       Family HX:    Family History  Problem Relation Age of Onset  . Coronary artery disease Father 38   Social HX:    History   Social History  . Marital Status: Married    Spouse Name: N/A    Number of Children: N/A  . Years of Education: N/A   Occupational History  . Not on file.   Social History Main Topics  .  Smoking status: Never Smoker   . Smokeless tobacco: Never Used  . Alcohol Use: No  . Drug Use: No  . Sexual Activity: Yes   Other Topics Concern  . Not on file   Social History Narrative  . No narrative on file     ROS:  All 11 ROS were addressed and are negative except what is stated in the HPI  PHYSICAL EXAM Filed Vitals:   10/15/13 0401  BP: 155/107  Pulse: 126  Resp: 24   General: Well developed, well nourished, in no acute distress Head:  Normal cephalic and atramatic  Lungs:  No wheezing Heart:   HRRR S1 S2 No JVD.  No abdominal bruits. No femoral bruits. Abdomen: Mild obesity Msk:   Normal strength and tone for age. Extremities:  No edema.  DP +1, 3+ right radial pulse Neuro: Alert and oriented X 3. Psych:  Normal affect, responds appropriately   Labs:   Lab Results  Component Value Date   WBC 12.7* 10/15/2013   HGB 12.9* 10/15/2013   HCT 37.3* 10/15/2013   MCV 87.8 10/15/2013   PLT 265 10/15/2013     Recent Labs Lab 10/15/13 0345  NA 140  K 2.8*  CL 100  CO2 24  BUN 14  CREATININE 1.10  CALCIUM 9.8  GLUCOSE 136*   Lab Results  Component Value Date   TROPONINI <0.30 10/15/2013   No results found for this  basename: PTT   Lab Results  Component Value Date   INR 0.85 10/15/2013     No results found for this basename: CHOL   No results found for this basename: HDL   No results found for this basename: LDLCALC   No results found for this basename: TRIG   No results found for this basename: CHOLHDL   No results found for this basename: LDLDIRECT      Radiology:  No results found.  EKG:  Sinus tachycardia, right bundle branch block, anterior ST elevation with reciprocal inferior ST depression  ASSESSMENT: Acute anterior MI, hyperlipidemia  PLAN:  He will be taken emergently for cardiac catheterization. He appears to be a candidate for drug-eluting stent. He'll need aggressive secondary prevention. Continue atorvastatin. Further plans  based on the cardiac cath results.  Corky Crafts, MD  10/15/2013  7:02 AM

## 2013-10-15 NOTE — Progress Notes (Signed)
[facility] BRIEF SICU PROGRESS NOTE  Day of Surgery  S/P Procedure(s) (LRB): CORONARY ARTERY BYPASS GRAFTING (CABG) (N/A) TRANSESOPHAGEAL ECHOCARDIOGRAM (TEE) (N/A)   Sedated on vent NSR w/ stable hemodynamics Chest tube output low UOP excellent Labs okay IABP pressure line not functional, likely clotted off  Plan: Probably best to remove IABP.  Otherwise routine postop.  Kenneth Humphrey 10/15/2013 2:04 PM

## 2013-10-15 NOTE — Anesthesia Postprocedure Evaluation (Signed)
Anesthesia Post Note  Patient: Kenneth Humphrey  Procedure(s) Performed: Procedure(s) (LRB): CORONARY ARTERY BYPASS GRAFTING (CABG) (N/A) TRANSESOPHAGEAL ECHOCARDIOGRAM (TEE) (N/A)  Anesthesia type: General  Patient location: ICU  Post pain: Pain level controlled  Post assessment: Post-op Vital signs reviewed  Last Vitals:  Filed Vitals:   10/15/13 1300  BP: 111/60  Pulse: 74  Temp: 36.4 C  Resp: 18    Post vital signs: stable  Level of consciousness: Patient remains intubated per anesthesia plan  Complications: No apparent anesthesia complications

## 2013-10-15 NOTE — Progress Notes (Signed)
Extubated to 4L nasal cannula after completing surgical wean protocol. Rn Comptroller. Nif was 30 FVC was 800.

## 2013-10-15 NOTE — Progress Notes (Addendum)
Right groin IABP pulled per Dr Hoyle Barr order. IABP without pulsitile waveform prior to removal.  IABP pulled with good blood flashback.  Pressure held for 20 minutes with no hematoma noted. Groin level 0 at completion of pull. Patient with +2 pedal pulse. VSS during procedure Pressure dressing applied.

## 2013-10-15 NOTE — Addendum Note (Signed)
Addendum created 10/15/13 1350 by Arta Bruce, MD   Modules edited: Anesthesia Attestations, Anesthesia Events, Notes Section   Notes Section:  File: [id number]

## 2013-10-15 NOTE — Progress Notes (Signed)
Echocardiogram Echocardiogram Transesophageal has been performed.  Kenneth Humphrey 10/15/2013, 7:47 AM

## 2013-10-15 NOTE — Anesthesia Procedure Notes (Signed)
Date/Time: 10/15/2013 7:40 AM Performed by: Alanda Amass A

## 2013-10-15 NOTE — ED Notes (Signed)
I transmitted first ECG but not complete per receiving Physician, so I took new ECG and transmitted second. I placed patient on full wall monitor, then applied nassal canula at 2Ltrs/min. Per protocol and nurse Reita Cliche.

## 2013-10-15 NOTE — ED Provider Notes (Signed)
CSN: [id number]     Arrival date & time 10/15/13  5929 History   First MD Initiated Contact with Patient 10/15/13 [phone]     Chief Complaint  Patient presents with  . Chest Pain     (Consider location/radiation/quality/duration/timing/severity/associated sxs/prior Treatment) Patient is a 53 y.o. male presenting with chest pain. The history is provided by the patient.  Chest Pain Pain location:  Substernal area Pain quality: burning and dull   Pain radiates to:  Does not radiate Pain radiates to the back: no   Pain severity:  Moderate Onset quality:  Sudden Duration:  30 minutes Timing:  Constant Progression:  Unchanged Chronicity:  New Context: at rest   Relieved by:  Nothing Worsened by:  Nothing tried Ineffective treatments:  Antacids Associated symptoms: diaphoresis   Associated symptoms: no abdominal pain and no shortness of breath   Risk factors: male sex   Risk factors: no Marfan's syndrome and no smoking     No past medical history on file. No past surgical history on file. No family history on file. History  Substance Use Topics  . Smoking status: Not on file  . Smokeless tobacco: Not on file  . Alcohol Use: Not on file    Review of Systems  Constitutional: Positive for diaphoresis.  Respiratory: Negative for shortness of breath.   Cardiovascular: Positive for chest pain.  Gastrointestinal: Negative for abdominal pain.  All other systems reviewed and are negative.     Allergies  Review of patient's allergies indicates not on file.  Home Medications   Prior to Admission medications   Not on File   There were no vitals taken for this visit. Physical Exam  Constitutional: He is oriented to person, place, and time. He appears well-developed and well-nourished.  HENT:  Head: Normocephalic and atraumatic.  Mouth/Throat: Oropharynx is clear and moist.  Eyes: Conjunctivae are normal. Pupils are equal, round, and reactive to light.  Neck: Normal range  of motion.  Cardiovascular: Normal rate, regular rhythm and intact distal pulses.   Pulmonary/Chest: Effort normal and breath sounds normal. He has no wheezes. He has no rales.  Abdominal: Soft. Bowel sounds are normal. There is no tenderness. There is no rebound and no guarding.  Musculoskeletal: Normal range of motion.  Neurological: He is alert and oriented to person, place, and time.  Skin: Skin is warm. He is diaphoretic.  Psychiatric: He has a normal mood and affect.    ED Course  Procedures (including critical care time) Labs Review Labs Reviewed  CBC WITH DIFFERENTIAL  BASIC METABOLIC PANEL  TROPONIN I  PROTIME-INR    Imaging Review No results found.   EKG Interpretation None      MDM   Final diagnoses:  STEMI (ST elevation myocardial infarction)     Date: 10/15/2013  Rate: 109  Rhythm: sinus tachycardia  QRS Axis: normal  Intervals: normal  ST/T Wave abnormalities: Stemi with reciprocal changes  Conduction Disutrbances:none  Narrative Interpretation:   Old EKG Reviewed: none available   Medications  0.9 %  sodium chloride infusion (not administered)    Followed by  0.9 %  sodium chloride infusion (not administered)  nitroGLYCERIN (NITROSTAT) SL tablet 0.4 mg (0.4 mg Sublingual Given 10/15/13 0353)  metoprolol (LOPRESSOR) 1 MG/ML injection (not administered)  heparin 5000 UNIT/ML injection (not administered)  nitroGLYCERIN (NITROSTAT) 0.4 MG SL tablet (not administered)  nitroGLYCERIN 0.2 mg/mL in dextrose 5 % infusion (not administered)  heparin ADULT infusion 100 units/mL (25000 units/250 mL) (not  administered)  heparin bolus via infusion 4,000 Units (not administered)  nitroGLYCERIN 0.2 mg/mL in dextrose 5 % infusion (not administered)  aspirin chewable tablet 324 mg (324 mg Oral Given 10/15/13 0353)  metoprolol (LOPRESSOR) injection 5 mg (5 mg Intravenous Given 10/15/13 0353)   MDM Reviewed: nursing note and vitals Interpretation: labs, ECG and  x-ray (WBC count elevated, per Dr. Eldridge Dace needs to go to cath lab) Total time providing critical care: 30-74 minutes. This excludes time spent performing separately reportable procedures and services. Consults: cardiology   Pain markedly improved post medication  CRITICAL CARE Performed by: Daija Routson K [place] Kretz-Rasch Total critical care time:31 minutes Critical care time was exclusive of separately billable procedures and treating other patients. Critical care was necessary to treat or prevent imminent or life-threatening deterioration. Critical care was time spent personally by me on the following activities: development of treatment plan with patient and/or surrogate as well as nursing, discussions with consultants, evaluation of patient's response to treatment, examination of patient, obtaining history from patient or surrogate, ordering and performing treatments and interventions, ordering and review of laboratory studies, ordering and review of radiographic studies, pulse oximetry and re-evaluation of patient's condition.     Jasmine Awe, MD 10/15/13 [phone]

## 2013-10-15 NOTE — Anesthesia Postprocedure Evaluation (Signed)
  Anesthesia Post-op Note  Patient: Kenneth Humphrey  Procedure(s) Performed: Procedure(s) with comments: CORONARY ARTERY BYPASS GRAFTING (CABG) (N/A) - CABG x4 TRANSESOPHAGEAL ECHOCARDIOGRAM (TEE) (N/A)  Patient Location: ICU  Anesthesia Type:General  Level of Consciousness: sedated, unresponsive and Patient remains intubated per anesthesia plan  Airway and Oxygen Therapy: Patient remains intubated per anesthesia plan  Post-op Pain: unable to evaluate, pt. sedated  Post-op Assessment: Post-op Vital signs reviewed, Patient's Cardiovascular Status Stable and Respiratory Function Stable  Post-op Vital Signs: Reviewed and stable  Last Vitals:  Filed Vitals:   10/15/13 1245  BP: 104/68  Pulse: 75  Resp: 16    Complications: No apparent anesthesia complications

## 2013-10-15 NOTE — Transfer of Care (Signed)
Immediate Anesthesia Transfer of Care Note  Patient: Decklin Pointer  Procedure(s) Performed: Procedure(s) with comments: CORONARY ARTERY BYPASS GRAFTING (CABG) (N/A) - CABG x4 TRANSESOPHAGEAL ECHOCARDIOGRAM (TEE) (N/A)  Patient Location: ICU  Anesthesia Type:General  Level of Consciousness: sedated and unresponsive  Airway & Oxygen Therapy: Patient remains intubated per anesthesia plan and Patient placed on Ventilator (see vital sign flow sheet for setting)  Post-op Assessment: Report given to PACU RN and Post -op Vital signs reviewed and stable  Post vital signs: Reviewed and stable  Complications: No apparent anesthesia complications

## 2013-10-15 NOTE — Progress Notes (Signed)
[facility] BRIEF SICU PROGRESS NOTE  Day of Surgery  S/P Procedure(s) (LRB): CORONARY ARTERY BYPASS GRAFTING (CABG) (N/A) TRANSESOPHAGEAL ECHOCARDIOGRAM (TEE) (N/A)   IABP removed uneventfully Extubated uneventfully Awake and alert, mild soreness in chest NSR w/ stable hemodynamics Chest tube output low UOP excellent  Plan: Continue routine postop  [name] 10/15/2013 6:46 PM

## 2013-10-16 ENCOUNTER — Encounter (HOSPITAL_COMMUNITY): Payer: Self-pay | Admitting: Thoracic Surgery (Cardiothoracic Vascular Surgery)

## 2013-10-16 ENCOUNTER — Encounter (HOSPITAL_COMMUNITY): Payer: Self-pay | Admitting: Cardiology

## 2013-10-16 ENCOUNTER — Inpatient Hospital Stay (HOSPITAL_COMMUNITY): Payer: BC Managed Care – PPO

## 2013-10-16 DIAGNOSIS — I219 Acute myocardial infarction, unspecified: Secondary | ICD-10-CM

## 2013-10-16 LAB — GLUCOSE, CAPILLARY
GLUCOSE-CAPILLARY: 108 mg/dL — AB (ref 70–99)
GLUCOSE-CAPILLARY: 113 mg/dL — AB (ref 70–99)
GLUCOSE-CAPILLARY: 120 mg/dL — AB (ref 70–99)
GLUCOSE-CAPILLARY: 121 mg/dL — AB (ref 70–99)
GLUCOSE-CAPILLARY: 122 mg/dL — AB (ref 70–99)
GLUCOSE-CAPILLARY: 126 mg/dL — AB (ref 70–99)
GLUCOSE-CAPILLARY: 128 mg/dL — AB (ref 70–99)
GLUCOSE-CAPILLARY: 134 mg/dL — AB (ref 70–99)
GLUCOSE-CAPILLARY: 135 mg/dL — AB (ref 70–99)
GLUCOSE-CAPILLARY: 136 mg/dL — AB (ref 70–99)
GLUCOSE-CAPILLARY: 156 mg/dL — AB (ref 70–99)
Glucose-Capillary: 111 mg/dL — ABNORMAL HIGH (ref 70–99)
Glucose-Capillary: 115 mg/dL — ABNORMAL HIGH (ref 70–99)
Glucose-Capillary: 108 mg/dL — ABNORMAL HIGH (ref 70–99)
Glucose-Capillary: 87 mg/dL (ref 70–99)
Glucose-Capillary: 124 mg/dL — ABNORMAL HIGH (ref 70–99)
Glucose-Capillary: 116 mg/dL — ABNORMAL HIGH (ref 70–99)

## 2013-10-16 LAB — CBC
HCT: 29.3 % — ABNORMAL LOW (ref 39.0–52.0)
HCT: 31.7 % — ABNORMAL LOW (ref 39.0–52.0)
HEMOGLOBIN: 10 g/dL — AB (ref 13.0–17.0)
HEMOGLOBIN: 10.7 g/dL — AB (ref 13.0–17.0)
MCH: 30.5 pg (ref 26.0–34.0)
MCH: 30.3 pg (ref 26.0–34.0)
MCHC: 34.1 g/dL (ref 30.0–36.0)
MCHC: 33.8 g/dL (ref 30.0–36.0)
MCV: 89.3 fL (ref 78.0–100.0)
MCV: 89.8 fL (ref 78.0–100.0)
PLATELETS: 205 10*3/uL (ref 150–400)
Platelets: 197 10*3/uL (ref 150–400)
RBC: 3.28 MIL/uL — ABNORMAL LOW (ref 4.22–5.81)
RBC: 3.53 MIL/uL — AB (ref 4.22–5.81)
RDW: 13.3 % (ref 11.5–15.5)
RDW: 13.3 % (ref 11.5–15.5)
WBC: 19.7 10*3/uL — AB (ref 4.0–10.5)
WBC: 20.9 10*3/uL — AB (ref 4.0–10.5)

## 2013-10-16 LAB — BASIC METABOLIC PANEL
BUN: 12 mg/dL (ref 6–23)
CHLORIDE: 105 meq/L (ref 96–112)
CO2: 22 meq/L (ref 19–32)
Calcium: 8.2 mg/dL — ABNORMAL LOW (ref 8.4–10.5)
Creatinine, Ser: 1.01 mg/dL (ref 0.50–1.35)
GFR calc Af Amer: >90 mL/min (ref >90)
GFR calc non Af Amer: 82 mL/min — ABNORMAL LOW (ref >90)
GLUCOSE: 141 mg/dL — AB (ref 70–99)
POTASSIUM: 4.4 meq/L (ref 3.7–5.3)
SODIUM: 138 meq/L (ref 137–147)

## 2013-10-16 LAB — POCT I-STAT 3, ART BLOOD GAS (G3+)
ACID-BASE DEFICIT: 5 mmol/L — AB (ref 0.0–2.0)
ACID-BASE DEFICIT: 4 mmol/L — AB (ref 0.0–2.0)
BICARBONATE: 21.1 meq/L (ref 20.0–24.0)
Bicarbonate: 20.5 mEq/L (ref 20.0–24.0)
O2 SAT: 98 %
O2 SAT: 96 %
PCO2 ART: 39 mmHg (ref 35.0–45.0)
PO2 ART: 87 mmHg (ref 80.0–100.0)
Patient temperature: 37.3
TCO2: 22 mmol/L (ref 0–100)
TCO2: 22 mmol/L (ref 0–100)
pCO2 arterial: 37.2 mmHg (ref 35.0–45.0)
pH, Arterial: 7.331 — ABNORMAL LOW (ref 7.350–7.450)
pH, Arterial: 7.364 (ref 7.350–7.450)
pO2, Arterial: 117 mmHg — ABNORMAL HIGH (ref 80.0–100.0)

## 2013-10-16 LAB — POCT I-STAT, CHEM 8
BUN: 10 mg/dL (ref 6–23)
BUN: 13 mg/dL (ref 6–23)
CALCIUM ION: 1.23 mmol/L (ref 1.12–1.23)
CREATININE: 1.1 mg/dL (ref 0.50–1.35)
Calcium, Ion: 1.2 mmol/L (ref 1.12–1.23)
Chloride: 104 mEq/L (ref 96–112)
Chloride: 98 mEq/L (ref 96–112)
Creatinine, Ser: 1.2 mg/dL (ref 0.50–1.35)
Glucose, Bld: 148 mg/dL — ABNORMAL HIGH (ref 70–99)
Glucose, Bld: 146 mg/dL — ABNORMAL HIGH (ref 70–99)
HCT: 34 % — ABNORMAL LOW (ref 39.0–52.0)
HEMATOCRIT: 31 % — AB (ref 39.0–52.0)
Hemoglobin: 11.6 g/dL — ABNORMAL LOW (ref 13.0–17.0)
Hemoglobin: 10.5 g/dL — ABNORMAL LOW (ref 13.0–17.0)
POTASSIUM: 4.6 meq/L (ref 3.7–5.3)
Potassium: 3.8 mEq/L (ref 3.7–5.3)
Sodium: 138 mEq/L (ref 137–147)
Sodium: 138 mEq/L (ref 137–147)
TCO2: 21 mmol/L (ref 0–100)
TCO2: 25 mmol/L (ref 0–100)

## 2013-10-16 LAB — MAGNESIUM
MAGNESIUM: 2.6 mg/dL — AB (ref 1.5–2.5)
Magnesium: 2 mg/dL (ref 1.5–2.5)

## 2013-10-16 LAB — CREATININE, SERUM
CREATININE: 1.12 mg/dL (ref 0.50–1.35)
GFR calc Af Amer: 84 mL/min — ABNORMAL LOW (ref >90)
GFR calc non Af Amer: 73 mL/min — ABNORMAL LOW (ref >90)

## 2013-10-16 LAB — POCT ACTIVATED CLOTTING TIME: Activated Clotting Time: 127 seconds

## 2013-10-16 MED ORDER — ATORVASTATIN CALCIUM 80 MG PO TABS
80.0000 mg | ORAL_TABLET | Freq: Every day | ORAL | Status: DC
  Administered 2013-10-16 – 2013-10-19 (×4): 80 mg via ORAL
  Filled 2013-10-16 (×4): qty 1

## 2013-10-16 MED ORDER — INSULIN ASPART 100 UNIT/ML ~~LOC~~ SOLN
0.0000 [IU] | SUBCUTANEOUS | Status: DC
  Administered 2013-10-16 – 2013-10-17 (×4): 2 [IU] via SUBCUTANEOUS

## 2013-10-16 MED ORDER — INSULIN DETEMIR 100 UNIT/ML ~~LOC~~ SOLN
10.0000 [IU] | Freq: Every day | SUBCUTANEOUS | Status: DC
  Filled 2013-10-16: qty 0.1

## 2013-10-16 MED ORDER — INSULIN DETEMIR 100 UNIT/ML ~~LOC~~ SOLN
10.0000 [IU] | Freq: Once | SUBCUTANEOUS | Status: AC
  Administered 2013-10-16: 10 [IU] via SUBCUTANEOUS
  Filled 2013-10-16: qty 0.1

## 2013-10-16 MED ORDER — FUROSEMIDE 10 MG/ML IJ SOLN
20.0000 mg | Freq: Three times a day (TID) | INTRAMUSCULAR | Status: AC
  Administered 2013-10-16 (×2): 20 mg via INTRAVENOUS
  Filled 2013-10-16: qty 2

## 2013-10-16 MED ORDER — SODIUM CHLORIDE 0.9 % IJ SOLN: 10.0000 mL | INTRAMUSCULAR | Status: DC | PRN

## 2013-10-16 MED ORDER — SODIUM CHLORIDE 0.9 % IV SOLN
INTRAVENOUS | Status: DC
  Administered 2013-10-16: 20 mL/h via INTRAVENOUS

## 2013-10-16 MED ORDER — MORPHINE SULFATE 2 MG/ML IJ SOLN
2.0000 mg | INTRAMUSCULAR | Status: DC | PRN
  Administered 2013-10-16: 2 mg via INTRAVENOUS
  Filled 2013-10-16: qty 1

## 2013-10-16 MED ORDER — SODIUM CHLORIDE 0.9 % IJ SOLN
10.0000 mL | Freq: Two times a day (BID) | INTRAMUSCULAR | Status: DC
  Administered 2013-10-16: 10 mL

## 2013-10-16 MED FILL — Magnesium Sulfate Inj 50%: INTRAMUSCULAR | Qty: 10 | Status: AC

## 2013-10-16 MED FILL — Lidocaine HCl IV Inj 20 MG/ML: INTRAVENOUS | Qty: 5 | Status: AC

## 2013-10-16 MED FILL — Electrolyte-R (PH 7.4) Solution: INTRAVENOUS | Qty: 5000 | Status: AC

## 2013-10-16 MED FILL — Heparin Sodium (Porcine) Inj 1000 Unit/ML: INTRAMUSCULAR | Qty: 30 | Status: AC

## 2013-10-16 MED FILL — Sodium Chloride IV Soln 0.9%: INTRAVENOUS | Qty: 2000 | Status: AC

## 2013-10-16 MED FILL — Heparin Sodium (Porcine) Inj 1000 Unit/ML: INTRAMUSCULAR | Qty: 10 | Status: AC

## 2013-10-16 MED FILL — Potassium Chloride Inj 2 mEq/ML: INTRAVENOUS | Qty: 40 | Status: AC

## 2013-10-16 MED FILL — Sodium Bicarbonate IV Soln 8.4%: INTRAVENOUS | Qty: 50 | Status: AC

## 2013-10-16 MED FILL — Mannitol IV Soln 20%: INTRAVENOUS | Qty: 500 | Status: AC

## 2013-10-16 NOTE — Progress Notes (Signed)
75f sheath was removed from right radial around 0900 this am. TR band was placed with 12cc's of air. Pt tolerated well, vital signs remained normal and nurse was at bedside. TR band instructions were given. Will continue to monitor.

## 2013-10-16 NOTE — Progress Notes (Addendum)
[address]       [name] [address]             [phone]      1 Day Post-Op Procedure(s) (LRB): CORONARY ARTERY BYPASS GRAFTING (CABG) (N/A) TRANSESOPHAGEAL ECHOCARDIOGRAM (TEE) (N/A)  Subjective:  Mr. Kenneth Humphrey some has incisional soreness.  He otherwise has no complaints.  Objective: Vital signs in last 24 hours: Temp:  [97.3 F (36.3 C)-99.9 F (37.7 C)] 99.5 F (37.5 C) (04/20 0700) Pulse Rate:  [38-107] 98 (04/20 0700) Cardiac Rhythm:  [-] Normal sinus rhythm (04/19 2000) Resp:  [14-31] 31 (04/20 0700) BP: (86-114)/(51-70) 107/63 mmHg (04/20 0700) SpO2:  [93 %-100 %] 98 % (04/20 0700) Arterial Line BP: (26-120)/(22-90) 47/36 mmHg (04/20 0600) FiO2 (%):  [40 %-50 %] 40 % (04/19 1730) Weight:  [220 lb (99.79 kg)-237 lb 14 oz (107.9 kg)] 237 lb 14 oz (107.9 kg) (04/20 0500)  Hemodynamic parameters for last 24 hours: PAP: (27-51)/(11-32) 31/13 mmHg CO:  [3.4 L/min-6.9 L/min] 6.9 L/min CI:  [1.5 L/min/m2-3 L/min/m2] 3 L/min/m2  Intake/Output from previous day: 04/19 0701 - 04/20 0700 In: 6022.3 [P.O.:180; I.V.:4312.3; Blood:350; NG/GT:30; IV Piggyback:1150] Out: 5480 [Urine:3890; Blood:1050; Chest Tube:540]  General appearance: alert, cooperative and no distress Heart: regular rate and rhythm Lungs: clear to auscultation bilaterally Abdomen: soft, non-tender; bowel sounds normal; no masses,  no organomegaly Extremities: edema trace Wound: clean and dry  Lab Results:  Recent Labs  10/15/13 1835 10/16/13 0304  WBC 25.4* 19.7*  HGB 11.8* 10.0*  HCT 34.7* 29.3*  PLT 245 205   BMET:  Recent Labs  10/15/13 0610  10/15/13 1251 10/15/13 1835 10/16/13 0304  NA 135*  < > 139  --  138  K 4.2  < > 4.2  --  4.4  CL 102  --   --   --  105  CO2 20  --   --   --  22  GLUCOSE 138*  < > 111*  --  141*  BUN 14  --   --   --  12  CREATININE 0.97  --   --  0.95 1.01  CALCIUM 8.5  --   --   --  8.2*  < > = values in this interval not  displayed.  PT/INR:  Recent Labs  10/15/13 1240  LABPROT 15.5*  INR 1.26   ABG    Component Value Date/Time   PHART 7.301* 10/15/2013 1250   HCO3 21.9 10/15/2013 1250   TCO2 23 10/15/2013 1250   ACIDBASEDEF 5.0* 10/15/2013 1250   O2SAT 95.0 10/15/2013 1250   CBG (last 3)   Recent Labs  10/16/13 0409 10/16/13 0503 10/16/13 0616  GLUCAP 113* 121* 115*    Assessment/Plan: S/P Procedure(s) (LRB): CORONARY ARTERY BYPASS GRAFTING (CABG) (N/A) TRANSESOPHAGEAL ECHOCARDIOGRAM (TEE) (N/A)  1.  CV- NSR, off all drips good rate and pressure control- will start Lopressor as tolerated 2.  Pulm- no acute issues, wean oxygen as tolerated, CXR with post op atelectasis 3. Renal- creatinine at 1.01, + volume overloaded weight is up 17 lbs, would benefit from IV Lasix if pressure can tolerate 4. Expected Acute Blood Loss Anemia- stable Hgb at 10.0 5. DM- patient borderline diabetic, A1c is 6.6, will wean insulin drip, start levemir 6. Dispo- patient progressing, off all drips, follow progression orders   LOS: 1 day    Erin Barrett 10/16/2013   I have seen and examined the patient and agree with  the assessment and plan as outlined.  Doing well POD1.  D/C radial sheath from cath lab, D/C swan and chest tubes.  Mobilize. Diuresis.  [name] 10/16/2013 8:13 AM

## 2013-10-16 NOTE — Progress Notes (Signed)
Utilization Review Completed.  

## 2013-10-16 NOTE — Progress Notes (Signed)
POD # 1 emergency CABG  Some pain after CT removal  BP 121/67  Pulse 99  Temp(Src) 97.8 F (36.6 C) (Oral)  Resp 33  Ht 6\' 3"  (1.905 m)  Wt 237 lb 14 oz (107.9 kg)  BMI 29.73 kg/m2  SpO2 97%   Intake/Output Summary (Last 24 hours) at 10/16/13 1740 Last data filed at 10/16/13 1700  Gross per 24 hour  Intake 2553.83 ml  Output   3160 ml  Net -606.17 ml   CBG well controlled  Continue current care

## 2013-10-17 ENCOUNTER — Inpatient Hospital Stay (HOSPITAL_COMMUNITY): Payer: BC Managed Care – PPO

## 2013-10-17 LAB — BASIC METABOLIC PANEL
BUN: 17 mg/dL (ref 6–23)
CALCIUM: 8.3 mg/dL — AB (ref 8.4–10.5)
CO2: 26 meq/L (ref 19–32)
CREATININE: 0.98 mg/dL (ref 0.50–1.35)
Chloride: 101 mEq/L (ref 96–112)
GFR calc Af Amer: >90 mL/min (ref >90)
GFR calc non Af Amer: >90 mL/min (ref >90)
GLUCOSE: 146 mg/dL — AB (ref 70–99)
Potassium: 3.9 mEq/L (ref 3.7–5.3)
Sodium: 137 mEq/L (ref 137–147)

## 2013-10-17 LAB — CBC
HCT: 28.8 % — ABNORMAL LOW (ref 39.0–52.0)
Hemoglobin: 9.7 g/dL — ABNORMAL LOW (ref 13.0–17.0)
MCH: 30.4 pg (ref 26.0–34.0)
MCHC: 33.7 g/dL (ref 30.0–36.0)
MCV: 90.3 fL (ref 78.0–100.0)
PLATELETS: 189 10*3/uL (ref 150–400)
RBC: 3.19 MIL/uL — ABNORMAL LOW (ref 4.22–5.81)
RDW: 13.3 % (ref 11.5–15.5)
WBC: 16.4 10*3/uL — ABNORMAL HIGH (ref 4.0–10.5)

## 2013-10-17 LAB — GLUCOSE, CAPILLARY
GLUCOSE-CAPILLARY: 145 mg/dL — AB (ref 70–99)
GLUCOSE-CAPILLARY: 140 mg/dL — AB (ref 70–99)
Glucose-Capillary: 137 mg/dL — ABNORMAL HIGH (ref 70–99)
Glucose-Capillary: 176 mg/dL — ABNORMAL HIGH (ref 70–99)
Glucose-Capillary: 130 mg/dL — ABNORMAL HIGH (ref 70–99)

## 2013-10-17 MED ORDER — INSULIN ASPART 100 UNIT/ML ~~LOC~~ SOLN
0.0000 [IU] | Freq: Three times a day (TID) | SUBCUTANEOUS | Status: DC
  Administered 2013-10-17: 2 [IU] via SUBCUTANEOUS
  Administered 2013-10-17: 4 [IU] via SUBCUTANEOUS
  Administered 2013-10-17: 2 [IU] via SUBCUTANEOUS
  Administered 2013-10-17: 4 [IU] via SUBCUTANEOUS
  Administered 2013-10-18: 2 [IU] via SUBCUTANEOUS

## 2013-10-17 MED ORDER — POTASSIUM CHLORIDE 10 MEQ/50ML IV SOLN
10.0000 meq | INTRAVENOUS | Status: AC
  Administered 2013-10-17 (×3): 10 meq via INTRAVENOUS
  Filled 2013-10-17: qty 50

## 2013-10-17 MED ORDER — SODIUM CHLORIDE 0.9 % IV SOLN: 250.0000 mL | INTRAVENOUS | Status: DC | PRN

## 2013-10-17 MED ORDER — LISINOPRIL 2.5 MG PO TABS
2.5000 mg | ORAL_TABLET | Freq: Every day | ORAL | Status: DC
  Administered 2013-10-17 – 2013-10-19 (×3): 2.5 mg via ORAL
  Filled 2013-10-17 (×3): qty 1

## 2013-10-17 MED ORDER — BISACODYL 5 MG PO TBEC: 10.0000 mg | DELAYED_RELEASE_TABLET | Freq: Every day | ORAL | Status: DC | PRN

## 2013-10-17 MED ORDER — FUROSEMIDE 40 MG PO TABS
40.0000 mg | ORAL_TABLET | Freq: Every day | ORAL | Status: AC
  Administered 2013-10-17 – 2013-10-19 (×3): 40 mg via ORAL
  Filled 2013-10-17 (×3): qty 1

## 2013-10-17 MED ORDER — SODIUM CHLORIDE 0.9 % IJ SOLN
3.0000 mL | Freq: Two times a day (BID) | INTRAMUSCULAR | Status: DC
  Administered 2013-10-17 – 2013-10-18 (×2): 3 mL via INTRAVENOUS

## 2013-10-17 MED ORDER — MOVING RIGHT ALONG BOOK
Freq: Once | Status: AC
  Administered 2013-10-17: 08:00:00
  Filled 2013-10-17: qty 1

## 2013-10-17 MED ORDER — POTASSIUM CHLORIDE CRYS ER 20 MEQ PO TBCR
20.0000 meq | EXTENDED_RELEASE_TABLET | Freq: Every day | ORAL | Status: AC
  Administered 2013-10-18 – 2013-10-19 (×2): 20 meq via ORAL
  Filled 2013-10-17 (×3): qty 1

## 2013-10-17 MED ORDER — BISACODYL 10 MG RE SUPP: 10.0000 mg | Freq: Every day | RECTAL | Status: DC | PRN

## 2013-10-17 MED ORDER — TRAMADOL HCL 50 MG PO TABS: 50.0000 mg | ORAL_TABLET | ORAL | Status: DC | PRN

## 2013-10-17 MED ORDER — SODIUM CHLORIDE 0.9 % IJ SOLN: 3.0000 mL | INTRAMUSCULAR | Status: DC | PRN

## 2013-10-17 NOTE — Progress Notes (Signed)
      [address]       [name] [address]             [phone]        CARDIOTHORACIC SURGERY PROGRESS NOTE   R2 Days Post-Op Procedure(s) (LRB): CORONARY ARTERY BYPASS GRAFTING (CABG) (N/A) TRANSESOPHAGEAL ECHOCARDIOGRAM (TEE) (N/A)  Subjective: Doing well.  Mild soreness in chest but improved since chest tubes out.  Already ambulated once this am.  Marginal appetite but no nausea or abdominal discomfort.  Objective: Vital signs: BP Readings from Last 1 Encounters:  10/17/13 136/75   Pulse Readings from Last 1 Encounters:  10/17/13 101   Resp Readings from Last 1 Encounters:  10/17/13 25   Temp Readings from Last 1 Encounters:  10/17/13 97.7 F (36.5 C) Oral    Hemodynamics: PAP: (29-51)/(14-34) 51/34 mmHg  Physical Exam:  Rhythm:   sinus  Breath sounds: clear  Heart sounds:  RRR  Incisions:  Dressing dry, intact  Abdomen:  Soft, non-distended, non-tender  Extremities:  Warm, well-perfused    Intake/Output from previous day: 04/20 0701 - 04/21 0700 In: 1125.6 [P.O.:260; I.V.:765.6; IV Piggyback:100] Out: 2405 [Urine:2235; Chest Tube:170] Intake/Output this shift:    Lab Results:  CBC: Recent Labs  10/16/13 1700 10/16/13 1737 10/17/13 0415  WBC 20.9*  --  16.4*  HGB 10.7* 10.5* 9.7*  HCT 31.7* 31.0* 28.8*  PLT 197  --  189    BMET:  Recent Labs  10/16/13 0304  10/16/13 1737 10/17/13 0415  NA 138  --  138 137  K 4.4  --  3.8 3.9  CL 105  --  98 101  CO2 22  --   --  26  GLUCOSE 141*  --  146* 146*  BUN 12  --  13 17  CREATININE 1.01  < > 1.20 0.98  CALCIUM 8.2*  --   --  8.3*  < > = values in this interval not displayed.   CBG (last 3)   Recent Labs  10/16/13 1928 10/16/13 2324 10/17/13 0326  GLUCAP 134* 116* 137*    ABG    Component Value Date/Time   PHART 7.364 10/15/2013 1918   PCO2ART 37.2 10/15/2013 1918   PO2ART 87.0 10/15/2013 1918   HCO3 21.1 10/15/2013 1918   TCO2 25 10/16/2013 1737   ACIDBASEDEF 4.0* 10/15/2013 1918   O2SAT 96.0 10/15/2013 1918    CXR: Clear w/ mild bibasilar atelectasis and effusion, L>R  Assessment/Plan: S/P Procedure(s) (LRB): CORONARY ARTERY BYPASS GRAFTING (CABG) (N/A) TRANSESOPHAGEAL ECHOCARDIOGRAM (TEE) (N/A)  Doing very well POD2 s/p acute STEMI w/ emergency CABG Expected post op acute blood loss anemia, mild, stable Expected post op volume excess, mild, diuresing Expected post op atelectasis, mild Borderline DMII w/ excellent glycemic control   Mobilize  Diuresis  Transfer 2W  Start low dose ACE-I  Change CBG's and SSI to ac/hs and observe  [name] 10/17/2013 7:42 AM

## 2013-10-17 NOTE — Progress Notes (Signed)
CARDIAC REHAB PHASE I   PRE:  Rate/Rhythm: 89 SR  BP:  Supine:   Sitting: 110/60  Standing:    SaO2: 94 RA  MODE:  Ambulation: 280 ft   POST:  Rate/Rhythm: 104  BP:  Supine:   Sitting: 110/70  Standing:    SaO2: 95 RA 1335-1410 Assisted X 1 and used walker t ambulate. Gait steady with walker. Pt c/o of a busy day. He is passing alot of gas and belching. Pt was able to walk 280 feet. VS stable Pt not talking much and seems exhausted. Pt back to bed after walk with call light in reach and wife present.  Melina Copa RN 10/17/2013 2:18 PM

## 2013-10-18 ENCOUNTER — Inpatient Hospital Stay (HOSPITAL_COMMUNITY): Payer: BC Managed Care – PPO

## 2013-10-18 LAB — CBC
HCT: 29.2 % — ABNORMAL LOW (ref 39.0–52.0)
HEMOGLOBIN: 9.6 g/dL — AB (ref 13.0–17.0)
MCH: 29.8 pg (ref 26.0–34.0)
MCHC: 32.9 g/dL (ref 30.0–36.0)
MCV: 90.7 fL (ref 78.0–100.0)
PLATELETS: 224 10*3/uL (ref 150–400)
RBC: 3.22 MIL/uL — AB (ref 4.22–5.81)
RDW: 13.4 % (ref 11.5–15.5)
WBC: 13.6 10*3/uL — AB (ref 4.0–10.5)

## 2013-10-18 LAB — BASIC METABOLIC PANEL
BUN: 14 mg/dL (ref 6–23)
CALCIUM: 8.9 mg/dL (ref 8.4–10.5)
CHLORIDE: 103 meq/L (ref 96–112)
CO2: 28 meq/L (ref 19–32)
Creatinine, Ser: 0.97 mg/dL (ref 0.50–1.35)
GFR calc Af Amer: >90 mL/min (ref >90)
GFR calc non Af Amer: >90 mL/min (ref >90)
GLUCOSE: 138 mg/dL — AB (ref 70–99)
Potassium: 3.9 mEq/L (ref 3.7–5.3)
Sodium: 142 mEq/L (ref 137–147)

## 2013-10-18 LAB — GLUCOSE, CAPILLARY: Glucose-Capillary: 143 mg/dL — ABNORMAL HIGH (ref 70–99)

## 2013-10-18 NOTE — Progress Notes (Signed)
CARDIAC REHAB PHASE I   PRE:  Rate/Rhythm: 102 ST  BP:  Supine:   Sitting: 110/68  Standing:    SaO2: 91%RA  MODE:  Ambulation: 690 ft   POST:  Rate/Rhythm: 108 ST  BP:  Supine:   Sitting: 130/70  Standing:    SaO2: 96%RA 0950-1022 Pt feeling better today. Walked 690 ft on RA with rolling walker and asst x 1. Tolerated well. Sats better with walk. Encouraged IS.   Luetta Nutting, RN BSN  10/18/2013 10:18 AM

## 2013-10-18 NOTE — Discharge Instructions (Signed)
Endoscopic Saphenous Vein Harvesting °Care After °Refer to this sheet in the next few weeks. These instructions provide you with information on caring for yourself after your procedure. Your caregiver may also give you more specific instructions. Your treatment has been planned according to current medical practices, but problems sometimes occur. Call your caregiver if you have any problems or questions after your procedure. °HOME CARE INSTRUCTIONS °Medicine °· Take whatever pain medicine your surgeon prescribes. Follow the directions carefully. Do not take over-the-counter pain medicine unless your surgeon says it is okay. Some pain medicine can cause bleeding problems for several weeks after surgery. °· Follow your surgeon's instructions about driving. You will probably not be permitted to drive after heart surgery. °· Take any medicines your surgeon prescribes. Any medicines you took before your heart surgery should be checked with your caregiver before you start taking them again. °Wound care °· Ask your surgeon how long you should keep wearing your elastic bandage or stocking. °· Check the area around your surgical cuts (incisions) whenever your bandages (dressings) are changed. Look for any redness or swelling. °· You will need to return to have the stitches (sutures) or staples taken out. Ask your surgeon when to do that. °· Ask your surgeon when you can shower or bathe. °Activity °· Try to keep your legs raised when you are sitting. °· Do any exercises your caregivers have given you. These may include deep breathing exercises, coughing, walking, or other exercises. °SEEK MEDICAL CARE IF: °· You have any questions about your medicines. °· You have more leg pain, especially if your pain medicine stops working. °· New or growing bruises develop on your leg. °· Your leg swells, feels tight, or becomes red. °· You have numbness in your leg. °SEEK IMMEDIATE MEDICAL CARE IF: °· Your pain gets much worse. °· Blood  or fluid leaks from any of the incisions. °· Your incisions become warm, swollen, or red. °· You have chest pain. °· You have trouble breathing. °· You have a fever. °· You have more pain near your leg incision. °MAKE SURE YOU: °· Understand these instructions. °· Will watch your condition. °· Will get help right away if you are not doing well or get worse. °Document Released: 02/25/2011 Document Revised: 09/07/2011 Document Reviewed: 02/25/2011 °[employer]® Patient Information ©2014 [employer]. °Coronary Artery Bypass Grafting, Care After °Refer to this sheet in the next few weeks. These instructions provide you with information on caring for yourself after your procedure. Your health care provider may also give you more specific instructions. Your treatment has been planned according to current medical practices, but problems sometimes occur. Call your health care provider if you have any problems or questions after your procedure. °WHAT TO EXPECT AFTER THE PROCEDURE °Recovery from surgery will be different for everyone. Some people feel well after 3 or 4 weeks, while for others it takes longer. After your procedure, it is typical to have the following: °· Nausea and a lack of appetite.   °· Constipation. °· Weakness and fatigue.   °· Depression or irritability.   °· Pain or discomfort at your incision site. °HOME CARE INSTRUCTIONS °· Only take over-the-counter or prescription medicines as directed by your health care provider. Take all medicines exactly as directed. Do not stop taking medicines or start any new medicines without first checking with your health care provider.   °· Take your pulse as directed by your health care provider. °· Perform deep breathing as directed by your health care provider. If you were   given a device called an incentive spirometer, use it to practice deep breathing several times a day. Support your chest with a pillow or your arms when you take deep breaths or cough. °· Keep  incision areas clean, dry, and protected. Remove or change any bandages (dressings) only as directed by your health care provider. You may have skin adhesive strips over the incision areas. Do not take the strips off. They will fall off on their own. °· Check incision areas daily for any swelling, redness, or drainage. °· If incisions were made in your legs, do the following: °· Avoid crossing your legs.   °· Avoid sitting for long periods of time. Change positions every 30 minutes.   °· Elevate your legs when you are sitting.   °· Wear compression stockings as directed by your health care provider. These stockings help keep blood clots from forming in your legs. °· Take showers once your health care provider approves. Until then, only take sponge baths. Pat incisions dry. Do not rub incisions with a washcloth or towel. Do not take tub baths or go swimming until your health care provider approves. °· Eat foods that are high in fiber, such as raw fruits and vegetables, whole grains, beans, and nuts. Meats should be lean cut. Avoid canned, processed, and fried foods. °· Drink enough fluids to keep your urine clear or pale yellow. °· Weigh yourself every day. This helps identify if you are retaining fluid that may make your heart and lungs work harder.   °· Rest and limit activity as directed by your health care provider. You may be instructed to: °· Stop any activity at once if you have chest pain, shortness of breath, irregular heartbeats, or dizziness. Get help right away if you have any of these symptoms. °· Move around frequently for short periods or take short walks as directed by your health care provider. Increase your activities gradually. You may need physical therapy or cardiac rehabilitation to help strengthen your muscles and build your endurance. °· Avoid lifting, pushing, or pulling anything heavier than 10 lb (4.5 kg) for at least 6 weeks after surgery. °· Do not drive until your health care provider  approves.  °· Ask your health care provider when you may return to work and resume sexual activity. °· Follow up with your health care provider as directed.   °SEEK MEDICAL CARE IF: °· You have swelling, redness, increasing pain, or drainage at the site of an incision.   °· You develop a fever.   °· You have swelling in your ankles or legs.   °· You have pain in your legs.   °· You have weight gain of 2 or more pounds a day. °· You are nauseous or vomit. °· You have diarrhea.  °SEEK IMMEDIATE MEDICAL CARE IF: °· You have chest pain that goes to your jaw or arms. °· You have shortness of breath.   °· You have a fast or irregular heartbeat.   °· You notice a "clicking" in your breastbone (sternum) when you move.   °· You have numbness or weakness in your arms or legs. °· You feel dizzy or lightheaded.   °MAKE SURE YOU: °· Understand these instructions. °· Will watch your condition. °· Will get help right away if you are not doing well or get worse. °Document Released: 01/02/2005 Document Revised: 02/15/2013 Document Reviewed: 11/22/2012 °[employer]® Patient Information ©2014 [employer]. ° °

## 2013-10-18 NOTE — Progress Notes (Addendum)
[address]       [place] [address]             [phone]      3 Days Post-Op  Procedure(s) (LRB): CORONARY ARTERY BYPASS GRAFTING (CABG) (N/A) TRANSESOPHAGEAL ECHOCARDIOGRAM (TEE) (N/A) Subjective: Looks and feels well, some right clavicle discomfort  Objective  Telemetry sinus rhythm   Temp:  [97.8 F (36.6 C)-98.5 F (36.9 C)] 98.5 F (36.9 C) (04/22 0423) Pulse Rate:  [88-104] 92 (04/22 0423) Resp:  [18-35] 18 (04/22 0423) BP: (99-132)/(60-79) 100/64 mmHg (04/22 0423) SpO2:  [92 %-98 %] 92 % (04/22 0423) Weight:  [223 lb 5.2 oz (101.3 kg)] 223 lb 5.2 oz (101.3 kg) (04/22 0500)   Intake/Output Summary (Last 24 hours) at 10/18/13 0732 Last data filed at 10/18/13 0424  Gross per 24 hour  Intake    350 ml  Output   1880 ml  Net  -1530 ml       General appearance: alert, cooperative and no distress Heart: regular rate and rhythm Lungs: mildly dim in the bases Abdomen: benign Extremities: minor edema Wound: incis healing well  Lab Results:  Recent Labs  10/16/13 0304 10/16/13 1700  10/17/13 0415 10/18/13 0500  NA 138  --   < > 137 142  K 4.4  --   < > 3.9 3.9  CL 105  --   < > 101 103  CO2 22  --   --  26 28  GLUCOSE 141*  --   < > 146* 138*  BUN 12  --   < > 17 14  CREATININE 1.01 1.12  < > 0.98 0.97  CALCIUM 8.2*  --   --  8.3* 8.9  MG 2.6* 2.0  --   --   --   < > = values in this interval not displayed. No results found for this basename: AST, ALT, ALKPHOS, BILITOT, PROT, ALBUMIN,  in the last 72 hours No results found for this basename: LIPASE, AMYLASE,  in the last 72 hours  Recent Labs  10/17/13 0415 10/18/13 0500  WBC 16.4* 13.6*  HGB 9.7* 9.6*  HCT 28.8* 29.2*  MCV 90.3 90.7  PLT 189 224   No results found for this basename: CKTOTAL, CKMB, TROPONINI,  in the last 72 hours No components found with this basename: POCBNP,  No results found for this basename: DDIMER,  in the last 72 hours No results found  for this basename: HGBA1C,  in the last 72 hours No results found for this basename: CHOL, HDL, LDLCALC, TRIG, CHOLHDL,  in the last 72 hours No results found for this basename: TSH, T4TOTAL, FREET3, T3FREE, THYROIDAB,  in the last 72 hours No results found for this basename: VITAMINB12, FOLATE, FERRITIN, TIBC, IRON, RETICCTPCT,  in the last 72 hours  Medications: Scheduled . acetaminophen  1,000 mg Oral 4 times per day  . aspirin EC  325 mg Oral Daily  . atorvastatin  80 mg Oral Daily  . bisacodyl  10 mg Oral Daily   Or  . bisacodyl  10 mg Rectal Daily  . docusate sodium  200 mg Oral Daily  . furosemide  40 mg Oral Daily  . insulin aspart  0-24 Units Subcutaneous TID AC & HS  . insulin regular  0-10 Units Intravenous TID WC  . lisinopril  2.5 mg Oral Daily  . metoprolol tartrate  12.5 mg Oral BID  . pantoprazole  40 mg Oral  Daily  . potassium chloride  20 mEq Oral Daily  . sodium chloride  3 mL Intravenous Q12H     Radiology/Studies:  Dg Chest Port 1 View  10/17/2013   CLINICAL DATA:  Postoperative evaluation.  Cardiac surgery.  EXAM: PORTABLE CHEST - 1 VIEW  COMPARISON:  DG CHEST 1V PORT dated 10/16/2013; DG CHEST 1V PORT dated 10/15/2013; DG CHEST 1V PORT dated 10/15/2013  FINDINGS: Right IJ sheath projects over the superior vena cava, interval retraction of central venous catheter. Interval removal mediastinal drains and left chest tube. Stable enlarged cardiac and mediastinal contours status post median sternotomy. Persistent heterogeneous opacities left lung base. Probable small left pleural effusion. No definite pneumothorax.  IMPRESSION: 1. Small left pleural effusion and left basilar atelectasis. 2. Interval removal left chest tube without definite pneumothorax.   Electronically Signed   By: Annia Belt M.D.   On: 10/17/2013 08:10    INR: Will add last result for INR, ABG once components are confirmed Will add last 4 CBG results once components are  confirmed  Assessment/Plan: S/P Procedure(s) (LRB): CORONARY ARTERY BYPASS GRAFTING (CABG) (N/A) TRANSESOPHAGEAL ECHOCARDIOGRAM (TEE) (N/A)  1 excellent progress overall 2 cont gentle diuresis 3  Routine pulmonary toilet and cardiac rehab 4 hemodynamically stable in sinus rhythm 5 labs stable 6 d/c cbg's 7 d/c epw's 8 poss d/c in 24-48 hours      LOS: 3 days    Kenneth Humphrey 4/22/20157:32 AM  I have seen and examined the patient and agree with the assessment and plan as outlined.  Kenneth Humphrey 10/18/2013 9:27 AM

## 2013-10-18 NOTE — Discharge Summary (Signed)
[address]       [name] [address]             [phone]       Kenneth Humphrey 01/19/1961 53 y.o. [id number]  10/15/2013   [name], MD  STEMI (ST elevation myocardial infarction) [410.90]   HPI:  This is a 53 y.o. male with a history of hyperlipidemia, Crohn disease and a family history of early coronary artery disease. The patient states that he has been having some chest discomfort over the past 3 months intermittently. He underwent a gallbladder scan which was negative him for some type of GI related discomfort. Indication he was at work and developed severe substernal chest discomfort. He immediately told his supervisor at work and was taken to the med center in [place] and bring EKG revealed anterior ST elevation. Code STEMI was called and he was transferred to [facility]. He did have episodes of tachycardia while at med center [facility] He received a heparin bolus as well as nitroglycerin and developed relief of symptoms. He arrived at [facility] with 1/10 chest pain. He was urgently taken to the cardiac catheterization lab. PMH:  Past Medical History   Diagnosis  Date   .  Crohn disease    .  Acute myocardial infarction of other anterior wall, initial episode of care     PSH: History reviewed. No pertinent past surgical history.  ALLERGIES: Review of patient's allergies indicates no known allergies.  Prior to Admit Meds:  Prescriptions prior to admission   Medication  Sig  Dispense  Refill   .  atorvastatin (LIPITOR) 10 MG tablet  Take 60 mg by mouth daily.      Family HX:  Family History   Problem  Relation  Age of Onset   .  Coronary artery disease  Father  43    Social HX:  History    Social History   .  Marital Status:  Married     Spouse Name:  N/A     Number of Children:  N/A   .  Years of Education:  N/A    Occupational History   .  Not on file.    Social History Main Topics   .  Smoking status:   Never Smoker   .  Smokeless tobacco:  Never Used   .  Alcohol Use:  No   .  Drug Use:  No   .  Sexual Activity:  Yes    Other Topics  Concern   .  Not on file    Social History Narrative   .  No narrative on file    ROS: All 11 ROS were addressed and are negative except what is stated in the HPI  PHYSICAL EXAM  Filed Vitals:    10/15/13 0401   BP:  155/107   Pulse:  126   Resp:  24    General: Well developed, well nourished, in no acute distress  Head: Normal cephalic and atramatic  Lungs: No wheezing  Heart: HRRR S1 S2 No JVD. No abdominal bruits. No femoral bruits.  Abdomen: Mild obesity  Msk: Normal strength and tone for age.  Extremities: No edema. DP +1, 3+ right radial pulse  Neuro: Alert and oriented X 3.  Psych: Normal affect, responds appropriately  CARDIAC CATHETERIZATION  PROCEDURE: Left heart catheterization with selective coronary angiography, left ventriculogram. PCI LAD, intra-aortic balloon pump placement  INDICATIONS:  Anterior ST elevation MI  The risks, benefits, and details of the procedure were explained to the patient. The patient verbalized understanding and wanted to proceed. Informed written consent was obtained.  PROCEDURE TECHNIQUE: After Xylocaine anesthesia a 28F slender sheath was placed in the right radial artery with a single anterior needle wall stick. IV heparin was given. Right coronary angiography was done using a Judkins R4 guide catheter. Left coronary angiography was done using a CLS 3.0 guide catheter. Left ventriculography was done using a pigtail catheter. The radial sheath was sewn in to be used as an arterial line in the operating room.  The right groin was prepped in sterile fashion. An intra-aortic balloon pump was placed under fluoroscopic guidance. This was placed on 1:1.  CONTRAST: Total of 180 cc.  COMPLICATIONS: None.  HEMODYNAMICS: Aortic pressure was 111/70; LV pressure was 118/9; LVEDP 14. There was no gradient between the left  ventricle and aorta.  ANGIOGRAPHIC DATA: The left main coronary artery is a large vessel and patent proximally. There is mild plaque in the distal left main which extends into the proximal portion of both the LAD and circumflex.  The left anterior descending artery is a large vessel. The ostial to proximal vessel is heavily diseased. There is a large first diagonal which branches across the lateral wall. The LAD is subtotally occluded just after the origin of this large first diagonal. There is visible thrombus in this portion of the LAD. There is TIMI 1 flow in the LAD. After the angioplasty was performed, it was noted that there are several small diagonals which are patent. There is one medium-sized diagonal which is also patent the likely too small to graft.  The left circumflex artery is a large vessel. There is a ostial to proximal 80% stenosis. In the mid circumflex there is more severe disease, up to 90%. There is an additional 80% blockage before a large first obtuse marginal. The obtuse marginal is patent.  The right coronary artery is a large dominant vessel. There is a proximal 80% stenosis, with moderate diffuse disease just past the proximal segment. The posterior descending artery is widely patent. Posterior lateral artery is a large vessel and widely patent. There are left to right collaterals filling the LAD.  LEFT VENTRICULOGRAM: Left ventricular angiogram was done in the 30 RAO projection and revealed moderately decreased systolic function with mid to distal anterior and apical hypokinesis of severe degree ; with an estimated ejection fraction of 30%. LVEDP was 14 mmHg.  PCI NARRATIVE: Given that he had multivessel disease at areas of bifurcation including the proximal to ostial LAD, proximal to ostial circumflex, and large diagonal vessel taking off in this area, we felt that bypass surgery would be optimal. The strategy was to try and restore flow into the LAD with balloon was and then  have the patient go for bypass surgery. Dr. Cornelius Moras was consulted.  A CLS 3.0 guiding catheters used. IV heparin was used for anticoagulation. IV tirofiban was also used. ACT was used to check that the heparin was therapeutic. A pro-water wire was placed into the large diagonal vessels. It was used to stabilize the guide. A BMW wire was advanced to the LAD but was unsuccessful in entering the thrombotic LAD occlusion. A Fielder XT wire was then advanced which did successfully navigated into the LAD. A 2.0 x 12 balloon was used to predilate the lesion. Ballooning in the mid LAD did not significantly improve flow. TIMI-3 flow was restored. We  then advanced the balloon further down in the mid LAD and did multiple balloon inflations. After that, there is flow restored in the LAD. A 2.5 balloon was then advanced and several balloon inflations were performed. There was no clear dissection visible. It was unclear how long it would take for him to be in the operating room. Therefore, the right groin was prepped and intra-aortic balloon pump was placed under fluoroscopic guidance as noted above. A 2.75 x 12 balloon was advanced to the LAD and another balloon dilatation was performed to try to maximize diameter prior to bypass. We debated whether the guide and wire should be left in place. Since the guide would sometimes go deep into the vessel and damp, we elected to pull the guidewire. TIMI-3 flow was maintained. The cardiac surgeon came in and spoke to the patient. He was then taken to the operating room from the Cath Lab.  IMPRESSIONS:  1. Patent left main coronary artery. 2. Subtotally occluded, thrombotic proximal left anterior descending artery. This was successfully treated with serial balloon dilatation. The largest balloon used was a 2.75 x 12 balloon. TIMI-3 flow was restored in the LAD. 3. Severe disease in the proximal and mid left circumflex artery. There is a large OM1 which appears to be a suitable target  for bypass graft. 4. Severely diseased ostial to proximal right coronary artery. There is moderate disease in the mid vessel. The posterior descending and posterior lateral arteries are patent. 5. Moderately decreased left ventricular systolic function. LVEDP 14 mmHg. Ejection fraction 30 %. 6. Intra-aortic balloon pump placed from the right groin.  7. Planned CABG post POBA based on severe 3 vessel disease.  RECOMMENDATION: The patient will be going for bypass surgery with Dr. Cornelius Moraswen, likely four-vessel bypass- after successful POBA of the LAD. He'll need aggressive secondary prevention postoperatively.  [place] [address] [place] Ultimately, he may choose to find followup cardiology care closer to home. We will be [place] if that is what he chooses.        Hospital Course: The patient was taken urgently to the cardiac catheterization lab where he was found to have severe three-vessel coronary artery disease. He was successfully treated with serial dilatation of the subtotally occluded thrombotic LAD. Following this procedure TIMI-3 flow was restored to the LAD. Urgent cardiothoracic surgical consultation was obtained with Tressie Stalkerlarence Owen M.D. who evaluated the patient and studies and agree with recommendations to proceed with urgent coronary artery bypass grafting. He was taken to the operating room and underwent the following procedure:  CARDIOTHORACIC SURGERY OPERATIVE NOTE  Date of Procedure: 10/15/2013  Preoperative Diagnosis: Severe 3-vessel Coronary Artery Disease  Postoperative Diagnosis: Same  Procedure:  Emergency Coronary Artery Bypass Grafting x 4  Left Internal Mammary Artery to Distal Left Anterior Descending Coronary Artery  Saphenous Vein Graft to Posterior Descending Coronary Artery  Saphenous Vein Graft to Ramus Intermediate Branch Coronary Artery and  Sequential Saphenous Vein Graft to Obtuse Marginal Branch of Left Circumflex Coronary Artery    Endoscopic Vein Harvest from Left Thigh and Lower Leg  Surgeon: Salvatore Decentlarence H. Cornelius Moraswen, MD  Assistant: Rowe ClackWayne E. Gold, PA-C  Anesthesia: Arta BruceKevin Ossey, MD  Operative Findings:  Normal LV function  Good quality left internal mammary conduit for grafting  Good quality saphenous vein conduit for grafting  Good quality target vessels for grafting The patient tolerated the procedure well and is transported to the surgical intensive care in stable condition. There are no intraoperative  complications. All sponge instrument and needle counts are verified correct at completion of the operation.   Postoperative hospital course: The patient has overall done quite well. He was extubated uneventfully using standard protocols. His intra-aortic balloon pump was removed uneventfully. He has maintained stable hemodynamics. All routine lines, monitors and drainage devices have been discontinued in the standard fashion. He has maintained normal sinus rhythm. He does have an expected acute blood loss anemia which is mild and stable. He did have some postoperative volume overload which has responded well to diuretics. He is tolerating gradually increasing activities using standard protocols. Incisions are noted to be healing well without evidence of infection. Currently his status is felt to be quite stable for tentative discharge in the next 24-48 hours pending ongoing reevaluation of his recovery.     Recent Labs  10/17/13 0415 10/18/13 0500  NA 137 142  K 3.9 3.9  CL 101 103  CO2 26 28  GLUCOSE 146* 138*  BUN 17 14  CALCIUM 8.3* 8.9    Recent Labs  10/17/13 0415 10/18/13 0500  WBC 16.4* 13.6*  HGB 9.7* 9.6*  HCT 28.8* 29.2*  PLT 189 224    Recent Labs  10/15/13 1240  INR 1.26     Discharge Instructions:  The patient is discharged to home with extensive instructions on wound care and progressive ambulation.  They are instructed not to drive or perform any heavy lifting until returning to see  the physician in his office.  Discharge Diagnosis:  STEMI (ST elevation myocardial infarction) [410.90]  Secondary Diagnosis: Patient Active Problem List   Diagnosis Date Noted  . Coronary artery disease 10/15/2013  . 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 Acute myocardial infarction of other anterior wall, initial episode of care   . Mixed hyperlipidemia   . Crohn disease    Past Medical History  Diagnosis Date  . Crohn disease   . Acute myocardial infarction of other anterior wall, initial episode of care 10/15/2013  . Mixed hyperlipidemia   . Coronary artery disease 10/15/2013  . 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     atorvastatin 80 MG tablet  Commonly known as:  LIPITOR  Take 80 mg by mouth daily.     lisinopril 2.5 MG tablet  Commonly known as:  PRINIVIL,ZESTRIL  Take 1 tablet (2.5 mg total) by mouth daily.     metoprolol tartrate 12.5 mg Tabs tablet  Commonly known as:  LOPRESSOR  Take 0.5 tablets (12.5 mg total) by mouth 2 (two) times daily.     MULTI VITAMIN DAILY PO  Take 1 tablet by mouth daily.     oxyCODONE 5 MG immediate release tablet  Commonly known as:  Oxy IR/ROXICODONE  Take 1-2 tablets (5-10 mg total) by mouth every 4 (four) hours as needed for moderate pain.       Follow-up Information   Follow up with [name], MD. (11/13/2013 at 4:30 pm to see the surgeon. Please obtain a chest xray at [facility] at 3:30 pm. [facility] located in the same office complex.)    Specialty:  Cardiothoracic Surgery   Contact information:   [address] [place] [address] [phone]       Follow up with Tereso Newcomer, PA-C. (11/08/13 at 9:50 am- cardiology appointment)    Specialty:  Physician Assistant   Contact  information:   [address] [place] [address] [phone]      The patient has been discharged on:   1.Beta Blocker:  Yes [ y  ]                              No   [   ]                              If No, reason:  2.Ace Inhibitor/ARB: Yes [ y  ]                                     No  [    ]                                     If No, reason:  3.Statin:   Yes [ y  ]                  No  [   ]                  If No, reason:  4.Ecasa:  Yes  [ y  ]                  No   [   ]                  If No, reason:  Disposition: home  Patient's condition is Good  Gershon Crane, PA-C 10/18/2013  9:25 AM

## 2013-10-18 NOTE — Progress Notes (Signed)
Ambulated X2 in hallway independently; pt ambulated approximately 700 ft on each walk; pt walked with front wheel rolling walker; pt is currently resting in chair; wife at bedside; call bell within reach.  Park Breed, RN

## 2013-10-18 NOTE — Progress Notes (Signed)
EPW removed per MD orders; PW intact; pt advised to stay on bedrest for one hour; VS Q 15 for one hour; wife at bedside; call bell within reach; will continue to monitor.  Park Breed, RN

## 2013-10-19 LAB — TYPE AND SCREEN
ABO/RH(D): O NEG
ANTIBODY SCREEN: NEGATIVE
UNIT DIVISION: 0
Unit division: 0

## 2013-10-19 MED ORDER — METOPROLOL TARTRATE 12.5 MG HALF TABLET: 12.5000 mg | ORAL_TABLET | Freq: Two times a day (BID) | ORAL | Status: DC

## 2013-10-19 MED ORDER — ASPIRIN 325 MG PO TBEC: 325.0000 mg | DELAYED_RELEASE_TABLET | Freq: Every day | ORAL | Status: DC

## 2013-10-19 MED ORDER — LISINOPRIL 2.5 MG PO TABS: 2.5000 mg | ORAL_TABLET | Freq: Every day | ORAL | Status: DC

## 2013-10-19 MED ORDER — OXYCODONE HCL 5 MG PO TABS: 5.0000 mg | ORAL_TABLET | ORAL | Status: DC | PRN

## 2013-10-19 NOTE — Progress Notes (Signed)
[id number] Cardiac Rehab Completed discharge education with pt and wife. They voice understanding. Pt agrees to Outpt. CRP in [place] at [facility], will send referral. We discussed low carb diet and his AIC. Beatrix Fetters, RN 10/19/2013 11:04 AM

## 2013-10-19 NOTE — Progress Notes (Signed)
Ambulated x3 in the hallway independently on RA using a front wheel walker. Pt ambulated approximately 500 ft each walk. Pt currently back in bed with no complaints of bed. Call light within reach will continue to monitor.

## 2013-10-19 NOTE — Progress Notes (Signed)
Assessment unchanged. Discussed D/C instructions with pt and wife including f/u appointments, medications, incision care, when to call the doctor, and importance of weighing self.  Pt verbalized understanding.  IV and tele removed.  2 CT sutures removed. Benzoin and 1/2" steri strips applied. Middle CT suture not removed due to unapproximated margins and leaking. PA aware. Office to call pt for appointment to remove remaining suture. Pt left via W/C accompanied by [employer] with belongings.

## 2013-10-19 NOTE — Care Management Note (Signed)
    Page 1 of 1   10/19/2013     3:58:00 PM CARE MANAGEMENT NOTE 10/19/2013  Patient:  Kenneth Humphrey, Kenneth Humphrey   Account Number:  [id number]  Date Initiated:  10/16/2013  Documentation initiated by:  MAYO,HENRIETTA  Subjective/Objective Assessment:   dx STEMI s/p CABG; lives with spouse     Action/Plan:   Anticipated DC Date:  10/19/2013   Anticipated DC Plan:  HOME/SELF CARE      DC Planning Services  CM consult      Choice offered to / List presented to:             Status of service:  Completed, signed off Medicare Important Message given?   (If response is "NO", the following Medicare IM given date fields will be blank) Date Medicare IM given:   Date Additional Medicare IM given:    Discharge Disposition:  HOME/SELF CARE  Per UR Regulation:  Reviewed for med. necessity/level of care/duration of stay  If discussed at Long Length of Stay Meetings, dates discussed:    Comments:  10/19/13 Nickie Retort, BSN [phone] Pt for dc home today; no dc needs identified.

## 2013-10-19 NOTE — Progress Notes (Signed)
[address]       [place] [address]             [phone]      4 Days Post-Op  Procedure(s) (LRB): CORONARY ARTERY BYPASS GRAFTING (CABG) (N/A) TRANSESOPHAGEAL ECHOCARDIOGRAM (TEE) (N/A) Subjective: Feels well  Objective  Telemetry sinus rhythm   Temp:  [98.3 F (36.8 C)-99.1 F (37.3 C)] 98.4 F (36.9 C) (04/23 0500) Pulse Rate:  [79-98] 96 (04/23 0500) Resp:  [17-18] 17 (04/23 0500) BP: (103-128)/(55-77) 128/77 mmHg (04/23 0500) SpO2:  [95 %] 95 % (04/23 0500) Weight:  [224 lb 13.9 oz (102 kg)] 224 lb 13.9 oz (102 kg) (04/23 0500)   Intake/Output Summary (Last 24 hours) at 10/19/13 0722 Last data filed at 10/18/13 1731  Gross per 24 hour  Intake    720 ml  Output    550 ml  Net    170 ml       General appearance: alert, cooperative and no distress Heart: regular rate and rhythm Lungs: slightly dim in the bases Abdomen: benign Extremities: no sig edema Wound: incisions healing well  Lab Results:  Recent Labs  10/16/13 1700  10/17/13 0415 10/18/13 0500  NA  --   < > 137 142  K  --   < > 3.9 3.9  CL  --   < > 101 103  CO2  --   --  26 28  GLUCOSE  --   < > 146* 138*  BUN  --   < > 17 14  CREATININE 1.12  < > 0.98 0.97  CALCIUM  --   --  8.3* 8.9  MG 2.0  --   --   --   < > = values in this interval not displayed. No results found for this basename: AST, ALT, ALKPHOS, BILITOT, PROT, ALBUMIN,  in the last 72 hours No results found for this basename: LIPASE, AMYLASE,  in the last 72 hours  Recent Labs  10/17/13 0415 10/18/13 0500  WBC 16.4* 13.6*  HGB 9.7* 9.6*  HCT 28.8* 29.2*  MCV 90.3 90.7  PLT 189 224   No results found for this basename: CKTOTAL, CKMB, TROPONINI,  in the last 72 hours No components found with this basename: POCBNP,  No results found for this basename: DDIMER,  in the last 72 hours No results found for this basename: HGBA1C,  in the last 72 hours No results found for this basename: CHOL, HDL,  LDLCALC, TRIG, CHOLHDL,  in the last 72 hours No results found for this basename: TSH, T4TOTAL, FREET3, T3FREE, THYROIDAB,  in the last 72 hours No results found for this basename: VITAMINB12, FOLATE, FERRITIN, TIBC, IRON, RETICCTPCT,  in the last 72 hours  Medications: Scheduled . acetaminophen  1,000 mg Oral 4 times per day  . aspirin EC  325 mg Oral Daily  . atorvastatin  80 mg Oral Daily  . bisacodyl  10 mg Oral Daily   Or  . bisacodyl  10 mg Rectal Daily  . docusate sodium  200 mg Oral Daily  . furosemide  40 mg Oral Daily  . lisinopril  2.5 mg Oral Daily  . metoprolol tartrate  12.5 mg Oral BID  . pantoprazole  40 mg Oral Daily  . potassium chloride  20 mEq Oral Daily  . sodium chloride  3 mL Intravenous Q12H     Radiology/Studies:  Dg Chest 2 View  10/18/2013   CLINICAL DATA:  Atelectasis, followup, post CABG  EXAM: CHEST  2 VIEW  COMPARISON:  Portable chest x-ray of 10/17/2013  FINDINGS: Aeration has improved. Mild bibasilar atelectasis is present with small pleural effusions remaining. Cardiomegaly is stable. Median sternotomy sutures are noted.  IMPRESSION: Improved aeration. Small effusions and mild basilar atelectasis remains.   Electronically Signed   By: Dwyane DeePaul  Barry M.D.   On: 10/18/2013 08:07    INR: Will add last result for INR, ABG once components are confirmed Will add last 4 CBG results once components are confirmed  Assessment/Plan: S/P Procedure(s) (LRB): CORONARY ARTERY BYPASS GRAFTING (CABG) (N/A) TRANSESOPHAGEAL ECHOCARDIOGRAM (TEE) (N/A) Plan for discharge: see discharge orders   LOS: 4 days    Kenneth Humphrey 4/23/20157:22 AM

## 2013-11-06 ENCOUNTER — Encounter: Payer: Self-pay | Admitting: *Deleted

## 2013-11-08 ENCOUNTER — Ambulatory Visit (INDEPENDENT_AMBULATORY_CARE_PROVIDER_SITE_OTHER): Payer: BC Managed Care – PPO | Admitting: Physician Assistant

## 2013-11-08 ENCOUNTER — Encounter: Payer: Self-pay | Admitting: Physician Assistant

## 2013-11-08 VITALS — BP 112/72 | HR 63 | Ht 76.0 in | Wt 207.8 lb

## 2013-11-08 DIAGNOSIS — I252 Old myocardial infarction: Secondary | ICD-10-CM

## 2013-11-08 DIAGNOSIS — I255 Ischemic cardiomyopathy: Secondary | ICD-10-CM

## 2013-11-08 DIAGNOSIS — E782 Mixed hyperlipidemia: Secondary | ICD-10-CM

## 2013-11-08 DIAGNOSIS — I2589 Other forms of chronic ischemic heart disease: Secondary | ICD-10-CM

## 2013-11-08 DIAGNOSIS — I2510 Atherosclerotic heart disease of native coronary artery without angina pectoris: Secondary | ICD-10-CM

## 2013-11-08 NOTE — Patient Instructions (Signed)
NO CHANGES WERE MADE WITH YOUR MEDICATIONS TODAY  Your physician has requested that you have an echocardiogram. Echocardiography is a painless test that uses sound waves to create images of your heart. It provides your doctor with information about the size and shape of your heart and how well your heart's chambers and valves are working. This procedure takes approximately one hour. There are no restrictions for this procedure.  YOU ARE BEING REFERRED TO THE LIPID CLINIC DX 272.4  MAKE SURE TO FOLLOW UP WITH YOUR PRIMARY CARE PHYSICIAN ABOUT ELEVATED GLUCOSE LEVELS  Your physician recommends that you schedule a follow-up appointment in: 6-8 WEEKS WITH DR. VARANASI

## 2013-11-08 NOTE — Progress Notes (Signed)
[address] [place], [place]  [address] Phone: [phone] Fax:  [phone]  Date:  11/08/2013   ID:  Kenneth Humphrey, DOB 12/28/1960, MRN [id number]  PCP:  Sheran Fava, MD  Cardiologist:  Dr. Everette Rank      History of Present Illness: Kenneth Humphrey is a 53 y.o. male with a hx of HL, Crohn's disease.  He was admitted 4/19-4/23 with an anterior STEMI.  Emergent cath demonstrated 3 v CAD with a subtotally occluded LAD.  The LAD was treated with POBA.  EF was 30% by LV gram.  He was then take for emergent CABG with Dr. Cornelius Moras (L-LAD, S-PDA, S-RI, S-OM).  Records indicate LV function was normal by intraoperative TEE.  Post op course was fairly uneventful.  He remained in NSR.  He returns for follow up.  He is doing well. He is up to walking 2-1/2 miles per day. His chest remains sore. He denies dyspnea. He denies PND or edema. He denies syncope.  He is concerned about Lipitor causing problems with diabetes. He was not aware that his hemoglobin A1c was abnormal from the hospital.   Studies:  - LHC (10/15/13):  LAD subtotal occluded, ostial to prox CFX 80%, mid CFX 90% then 80%, prox RCA80%, EF 30% with ant and apical HK.  PCI:  POBA to LAD.   Recent Labs: 10/15/2013: ALT 47; HDL Cholesterol 27*; LDL (calc) 152*  10/18/2013: Creatinine 0.97; Hemoglobin 9.6*; Potassium 3.9 10/15/2013: Hemoglobin-A1c 6.6*    Wt Readings from Last 3 Encounters:  11/08/13 207 lb 12.8 oz (94.257 kg)  10/19/13 224 lb 13.9 oz (102 kg)  10/19/13 224 lb 13.9 oz (102 kg)     Past Medical History  Diagnosis Date  . Crohn disease   . Acute myocardial infarction of other anterior wall, initial episode of care 10/15/2013  . Mixed hyperlipidemia   . Coronary artery disease 10/15/2013  . 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 aspirin EC 325 MG  EC tablet Take 1 tablet (325 mg total) by mouth daily.      Marland Kitchen atorvastatin (LIPITOR) 80 MG tablet Take 80 mg by mouth daily.      Marland Kitchen co-enzyme Q-10 50 MG capsule Take 50 mg by mouth daily.      Marland Kitchen lisinopril (PRINIVIL,ZESTRIL) 2.5 MG tablet Take 1 tablet (2.5 mg total) by mouth daily.  30 tablet  1  . metoprolol tartrate (LOPRESSOR) 12.5 mg TABS tablet Take 0.5 tablets (12.5 mg total) by mouth 2 (two) times daily.  60 each  1  . Multiple Vitamin (MULTI VITAMIN DAILY PO) Take 1 tablet by mouth daily.      Marland Kitchen oxyCODONE (OXY IR/ROXICODONE) 5 MG immediate release tablet Take 1-2 tablets (5-10 mg total) by mouth every 4 (four) hours as needed for moderate pain.  50 tablet  0   No current facility-administered medications for this visit.    Allergies:   Fish allergy   Social History:  The patient  reports that he has never smoked. He has never used smokeless tobacco. He reports that he does not drink alcohol or use illicit drugs.   Family History:  The patient's family history includes Coronary artery disease (age of onset: 30) in his father.   ROS:  Please see the history of  present illness.      All other systems reviewed and negative.   PHYSICAL EXAM: VS:  BP 112/72  Pulse 63  Ht 6\' 4"  (1.93 m)  Wt 207 lb 12.8 oz (94.257 kg)  BMI 25.30 kg/m2 Well nourished, well developed, in no acute distress HEENT: normal Neck: no JVD Cardiac:  normal S1, S2; RRR; no murmur Chest: Median sternotomy wound well healed without erythema or discharge Lungs:  clear to auscultation bilaterally, no wheezing, rhonchi or rales Abd: soft, nontender, no hepatomegaly Ext: no edema Skin: warm and dry Neuro:  CNs 2-12 intact, no focal abnormalities noted  EKG:  NSR, HR 63, normal axis, nonspecific ST-T wave changes     ASSESSMENT AND PLAN:  1. S/P Anterior STEMI 09/2013 tx'd with POBA of LAD => Emergent CABG: He is progressing well after recent admission to the hospital for anterior STEMI and eventual CABG. He  has already arranged cardiac rehabilitation in [place]. Continue aspirin, statin, beta blocker, ACE inhibitor. He will follow up with Dr. Cornelius Moras soon. 2. Coronary artery disease:  Continue aspirin, statin, beta blocker. 3. Ischemic cardiomyopathy: Continue beta blocker and ACE inhibitor. Reports indicate his LV function was improved at the time of his bypass. I will arrange a formal echocardiogram to document his ejection fraction. 4. Mixed hyperlipidemia:  He has had a significant history of hyperlipidemia that is uncontrolled. His LDL is 150 on high dose Lipitor. I will arrange referral to lipid clinic. He may be a candidate for one of the PCSK9 trials. 5. Diabetes: Hemoglobin A1c was 6.6 in the hospital. I have recommended follow up with his primary care physician. We had a long discussion about the possibility of statins contributing to early onset of diabetes. At this point, he requires statin therapy given his significant coronary artery disease. 6. Disposition: Follow up with Dr. Everette Rank in 6-8 weeks.  Signed, Tereso Newcomer, PA-C  11/08/2013 10:10 AM

## 2013-11-09 ENCOUNTER — Other Ambulatory Visit: Payer: Self-pay | Admitting: Thoracic Surgery (Cardiothoracic Vascular Surgery)

## 2013-11-09 DIAGNOSIS — I2109 ST elevation (STEMI) myocardial infarction involving other coronary artery of anterior wall: Secondary | ICD-10-CM

## 2013-11-13 ENCOUNTER — Ambulatory Visit (INDEPENDENT_AMBULATORY_CARE_PROVIDER_SITE_OTHER): Payer: Self-pay | Admitting: Thoracic Surgery (Cardiothoracic Vascular Surgery)

## 2013-11-13 ENCOUNTER — Encounter: Payer: Self-pay | Admitting: Thoracic Surgery (Cardiothoracic Vascular Surgery)

## 2013-11-13 ENCOUNTER — Ambulatory Visit
Admission: RE | Admit: 2013-11-13 | Discharge: 2013-11-13 | Disposition: A | Discharge status: Home / Self Care | Payer: BC Managed Care – PPO | Source: Ambulatory Visit | Attending: Thoracic Surgery (Cardiothoracic Vascular Surgery) | Admitting: Thoracic Surgery (Cardiothoracic Vascular Surgery)

## 2013-11-13 VITALS — BP 110/73 | HR 90 | Resp 20 | Ht 76.0 in | Wt 207.0 lb

## 2013-11-13 DIAGNOSIS — I2109 ST elevation (STEMI) myocardial infarction involving other coronary artery of anterior wall: Secondary | ICD-10-CM

## 2013-11-13 DIAGNOSIS — Z951 Presence of aortocoronary bypass graft: Secondary | ICD-10-CM

## 2013-11-13 DIAGNOSIS — I2510 Atherosclerotic heart disease of native coronary artery without angina pectoris: Secondary | ICD-10-CM

## 2013-11-13 NOTE — Patient Instructions (Signed)
The patient should continue to avoid any heavy lifting or strenuous use of arms or shoulders for at least a total of three months from the time of surgery.  The patient may return to driving an automobile as long as they are no longer requiring oral narcotic pain relievers during the daytime.  It would be wise to start driving only short distances during the daylight and gradually increase from there as they feel comfortable.  The patient is reminded to make every effort to keep their diabetes under very tight control.  They should follow up closely with their primary care physician or endocrinologist and strive to keep their hemoglobin A1c levels as low as possible, preferably near or below 6.0

## 2013-11-13 NOTE — Progress Notes (Signed)
[address]       [place] [id number]             [phone]     CARDIOTHORACIC SURGERY OFFICE NOTE  Referring Provider is Corky CraftsVaranasi, Jayadeep S, MD PCP is Sheran FavaMEYER, PAUL FREDRICK, MD   HPI:  Patient returns for routine followup status post coronary artery bypass grafting x4 on 10/15/2013.  He presented with an acute evolving ST segment elevation myocardial infarction. He was initially treated with primary balloon angioplasty of the culprit vessel then subsequently taken directly to surgery for urgent revascularization.  His postoperative recovery was uneventful. Since hospital discharge she has continued to do quite well. He was seen in followup by Tereso NewcomerScott Weaver at the cardiology office last week and he returns to our office for routine followup today. He reports doing remarkably well. He has mild residual soreness in his chest. He has not been taking any sort of pain relievers. He is walking 2-1/2 miles every day at a fairly vigorous pace. He reports no exertional shortness of breath or chest discomfort. His appetite is good. He is sleeping well at night. He has no complaints. He has been scheduled to start [place] palp[place] cardiac [place] early next month.   Current Outpatient Prescriptions  Medication Sig Dispense Refill  . aspirin EC 325 MG EC tablet Take 1 tablet (325 mg total) by mouth daily.      Marland Kitchen. atorvastatin (LIPITOR) 80 MG tablet Take 80 mg by mouth daily.      Marland Kitchen. co-enzyme Q-10 50 MG capsule Take 50 mg by mouth daily.      Marland Kitchen. lisinopril (PRINIVIL,ZESTRIL) 2.5 MG tablet Take 1 tablet (2.5 mg total) by mouth daily.  30 tablet  1  . metoprolol tartrate (LOPRESSOR) 12.5 mg TABS tablet Take 0.5 tablets (12.5 mg total) by mouth 2 (two) times daily.  60 each  1  . Multiple Vitamin (MULTI VITAMIN DAILY PO) Take 1 tablet by mouth daily.       No current facility-administered medications for this visit.      Physical Exam:   BP  110/73  Pulse 90  Resp 20  Ht 6\' 4"  (1.93 m)  Wt 207 lb (93.895 kg)  BMI 25.21 kg/m2  SpO2 98%  General:  Well-appearing  Chest:   Clear to auscultation with symmetrical breath sounds  CV:   Regular rate and rhythm without murmur  Incisions:  Clean and dry and healing nicely, sternum is stable  Abdomen:  Soft nontender  Extremities:  Warm and well-perfused with no edema  Diagnostic Tests:  CHEST 2 VIEW  COMPARISON: Chest x-ray of 10/18/2013  FINDINGS:  No active infiltrate or effusion is seen. The previously noted small  effusions have resolved. Mediastinal contours are stable and the  heart is within upper limits of normal. Median sternotomy sutures  are noted from CABG. No bony abnormality is seen.  IMPRESSION:  No active lung disease.  Electronically Signed  By: Dwyane DeePaul Barry M.D.  On: 11/13/2013 15:41    Impression:  Patient is doing remarkably well just 4 weeks status post coronary artery bypass grafting following acute ST segment elevation myocardial infarction.  Plan:  I've encouraged patient to continue to gradually increase his physical activity as tolerated with his primary limitation at this point remaining that he refrain from heavy lifting or strenuous use of his arms or shoulders for at least another 2 months. Because his work is fairly strenuous and requires  long shifts at night, he probably should not return to work until 3 months postoperatively.  He may resume driving an automobile. I've encouraged him to participate in the cardiac rehabilitation program. We discussed risks modification. All of his questions been addressed. The patient will return next April for routine followup 1 year postop.   Salvatore Decent. Cornelius Moras, MD 11/13/2013 4:09 PM

## 2013-11-14 ENCOUNTER — Ambulatory Visit (INDEPENDENT_AMBULATORY_CARE_PROVIDER_SITE_OTHER): Payer: BC Managed Care – PPO | Admitting: Pharmacist

## 2013-11-14 VITALS — Wt 207.0 lb

## 2013-11-14 DIAGNOSIS — E782 Mixed hyperlipidemia: Secondary | ICD-10-CM

## 2013-11-14 DIAGNOSIS — Z79899 Other long term (current) drug therapy: Secondary | ICD-10-CM

## 2013-11-14 MED ORDER — EZETIMIBE 10 MG PO TABS: 10.0000 mg | ORAL_TABLET | Freq: Every day | ORAL | Status: DC

## 2013-11-14 NOTE — Progress Notes (Signed)
Patient is a pleasant 53 y.o. WM referred to lipid clinic given recent MI and CABG while on Lipitor 80 mg qd.  At time of MI / CABG (09/2013), his LDL was 152 mg/dL despite already taking Lipitor.  He has been fighting elevated cholesterol his whole life, and saw his father do the same thing.  His father had an MI at age 33 y.o..  Patient has drastically changed his diet since his event last month, however he understands much further reduction in LDL is needed given his history.  Patient is interested in hearing more about PCSK-9 inhibitor agents.  We discussed SPIRE-II trial, and also the fact that 2 of these agents are expected to be on the market in 3 months.  Patient is walking 7 days per week now, for at least 2 miles per day.    RF:  MI, CABG (3 vessel), age, low HDL - LDL goal < 70 preferably, and at least < 100 given elevated baseline LDL (LDL was 152 on Lipitor 80 mg qd) Meds:  Lipitor 80 mg qd  Diet:  Now eating eggs and toast, or cereal every morning.  Typically eats 4-5 eggs per day.  Lunch is typically chicken and a salad, and dinner is chicken or pork with vegetables.  He eats red meat only once weekly.  Is allergic to fish (GI upset and vomiting).  Use to drinks soda frequently, but since event, he has stopped these entirely.  Drinks unsweetened tea or water now.  Rarely eats desserts.  Has an apple or yogurt for snack. Exercise:  Walks 2-3 miles daily. Family history:  Father had an MI at 34 y.o..  He died of a brain aneurysm.  Was a heavy smoker.  Patient is an only child. Social history:  Patient doesn't smoke.  He lives in [place], so travels over an hour to come here.  Works near [employer] though.  Labs:   09/2013:  TC 205, TG 131, HDL 27, LDL 152 (on Lipitor 80 mg qd)  Current Outpatient Prescriptions  Medication Sig Dispense Refill  . aspirin EC 325 MG EC tablet Take 1 tablet (325 mg total) by mouth daily.      Marland Kitchen atorvastatin (LIPITOR) 80 MG tablet Take 80 mg by mouth daily.      Marland Kitchen  co-enzyme Q-10 50 MG capsule Take 50 mg by mouth daily.      Marland Kitchen lisinopril (PRINIVIL,ZESTRIL) 2.5 MG tablet Take 1 tablet (2.5 mg total) by mouth daily.  30 tablet  1  . metoprolol tartrate (LOPRESSOR) 12.5 mg TABS tablet Take 0.5 tablets (12.5 mg total) by mouth 2 (two) times daily.  60 each  1  . Multiple Vitamin (MULTI VITAMIN DAILY PO) Take 1 tablet by mouth daily.       No current facility-administered medications for this visit.   Allergies  Allergen Reactions  . Fish Allergy Anaphylaxis   Family History  Problem Relation Age of Onset  . Coronary artery disease Father 59

## 2013-11-14 NOTE — Assessment & Plan Note (Addendum)
Patient and I discussed PCSK-9 inhibitors at length.  Instead of entering into clinical trial, he would like to wait until they are on the market in 3 months from now.  For the time being, he is agreeable to add Zetia 10 mg qd to regimen.  Will see me back in 3 months, and likely stop Zetia and start whichever PCSK-9 inhibitor his insurance will cover.  Patient will cut out eggs, or at least change to Egg Beaters for breakfast.  Continue exercise and other low fat diet options.  He will call if he has any problems with regimen b/t now and our appointment in 01/2014. Plan: 1.  Add Zetia 10 mg once daily. 2.  Continue atorvastatin 80 mg once daily. 3.  Cut out eggs in diet, or instead use egg beaters. 4.  Recheck cholesterol in 3 months (01/31/14 am, show up anytime after 7:30 am),  and see Riki Rusk the next week on 02/08/14 at 8:30 am

## 2013-11-14 NOTE — Patient Instructions (Signed)
1.  Add Zetia 10 mg once daily. 2.  Continue atorvastatin 80 mg once daily. 3.  Cut out eggs in diet, or instead use egg beaters. 4.  Recheck cholesterol in 3 months (01/31/14 am, show up anytime after 7:30 am),  and see Riki Rusk the next week on 02/08/14 at 8:30 am   Call Riki Rusk if problems or questions [phone]

## 2013-11-23 ENCOUNTER — Ambulatory Visit (HOSPITAL_COMMUNITY): Payer: BC Managed Care – PPO | Attending: Internal Medicine | Admitting: Radiology

## 2013-11-23 DIAGNOSIS — I2510 Atherosclerotic heart disease of native coronary artery without angina pectoris: Secondary | ICD-10-CM

## 2013-11-23 DIAGNOSIS — I2589 Other forms of chronic ischemic heart disease: Secondary | ICD-10-CM

## 2013-11-23 DIAGNOSIS — I252 Old myocardial infarction: Secondary | ICD-10-CM

## 2013-11-23 DIAGNOSIS — I255 Ischemic cardiomyopathy: Secondary | ICD-10-CM

## 2013-11-23 NOTE — Progress Notes (Signed)
Echocardiogram performed.  

## 2013-11-24 ENCOUNTER — Telehealth: Payer: Self-pay | Admitting: *Deleted

## 2013-11-24 ENCOUNTER — Encounter: Payer: Self-pay | Admitting: Physician Assistant

## 2013-11-24 NOTE — Telephone Encounter (Signed)
pt notified about echo results with verabal understanding

## 2013-12-07 ENCOUNTER — Other Ambulatory Visit: Payer: Self-pay | Admitting: *Deleted

## 2013-12-07 MED ORDER — LISINOPRIL 2.5 MG PO TABS: 2.5000 mg | ORAL_TABLET | Freq: Every day | ORAL | Status: DC

## 2013-12-12 ENCOUNTER — Other Ambulatory Visit: Payer: Self-pay

## 2013-12-12 MED ORDER — LISINOPRIL 2.5 MG PO TABS: 2.5000 mg | ORAL_TABLET | Freq: Every day | ORAL | Status: DC

## 2013-12-18 ENCOUNTER — Encounter: Payer: Self-pay | Admitting: Pharmacist

## 2014-01-04 ENCOUNTER — Ambulatory Visit (INDEPENDENT_AMBULATORY_CARE_PROVIDER_SITE_OTHER): Payer: BC Managed Care – PPO | Admitting: Interventional Cardiology

## 2014-01-04 ENCOUNTER — Encounter: Payer: Self-pay | Admitting: Interventional Cardiology

## 2014-01-04 ENCOUNTER — Telehealth: Payer: Self-pay | Admitting: Pharmacist

## 2014-01-04 VITALS — BP 122/64 | HR 61 | Ht 76.0 in | Wt 197.8 lb

## 2014-01-04 DIAGNOSIS — R5381 Other malaise: Secondary | ICD-10-CM

## 2014-01-04 DIAGNOSIS — E782 Mixed hyperlipidemia: Secondary | ICD-10-CM

## 2014-01-04 DIAGNOSIS — Z951 Presence of aortocoronary bypass graft: Secondary | ICD-10-CM

## 2014-01-04 DIAGNOSIS — R5383 Other fatigue: Secondary | ICD-10-CM

## 2014-01-04 DIAGNOSIS — I2510 Atherosclerotic heart disease of native coronary artery without angina pectoris: Secondary | ICD-10-CM

## 2014-01-04 NOTE — Telephone Encounter (Signed)
Patient came in to the office to see Dr. Eldridge Dace today for follow up, and brought a lipid panel that he had drawn with his PCP on 12/18/13.  Patient added Zetia 10 mg to his Lipitor 80 mg in middle of 10/2013, so these labs represent Zetia x 4 weeks.  He originally was going to see me in 01/2014 as he wanted to discuss option of getting on PCSK-9 inhibitor as LDL was ~ 150 mg/dL on Lipitor 80 mg qd.  His LDL is now down to 132 mg/dL on Lipitor 80 mg + Zetia 10 mg qd.  He has struggled with elevated LDL all of his life, and given CAD at young age, he would like to lower LDL as much as possible.  RF: MI, CABG (3 vessel), age, low HDL - LDL goal < 70 preferably, and at least < 100 given elevated baseline LDL (LDL was 152 on Lipitor 80 mg qd)  Meds: Lipitor 80 mg qd, Zetia 10 mg qd  Labs:  12/18/13:  TC 291, TG 157, LDL 132, HDL 28, LFTs normal, TSH normal (labs from PCP -  Lipitor 80 mg + Zetia 10 mg qd) 09/2013: TC 205, TG 131, HDL 27, LDL 152 (on Lipitor 80 mg qd)  Patient will continue same regimen and f/u with me in 01/2014 as scheduled to discuss where the PCSK-9 inhibitors in terms of approval, indication, cost, etc.  He will not need to get his labs in 01/2014 prior to appointment as he got these in 11/2013 already on lipitor / Zetia.  Patient notified.

## 2014-01-04 NOTE — Progress Notes (Signed)
Patient ID: Kenneth Humphrey, male   DOB: 10-17-1960, 53 y.o.   MRN: [id number]  [address] [place], [place]  [address] Phone: [phone] Fax:  [phone]  Date:  01/04/2014   ID:  Kenneth Humphrey, DOB 24-Nov-1960, MRN [id number]  PCP:  Sheran Fava, MD  Cardiologist:  Dr. Everette Rank      History of Present Illness: Kenneth Humphrey is a 53 y.o. male with a hx of HL, Crohn's disease.  He was admitted 4/19-4/23 with an anterior STEMI.  Emergent cath demonstrated 3 v CAD with a subtotally occluded LAD.  The LAD was treated with POBA.  EF was 30% by LV gram.  He was then take for emergent CABG with Dr. Cornelius Moras (L-LAD, S-PDA, S-RI, S-OM).  Records indicate LV function was normal by intraoperative TEE.  Post op course was fairly uneventful.  He remained in NSR.  He returns for follow up.  He is doing well. He is up to walking 2-1/2 to 4 miles per day, and doing cardiac rehab. His chest remains sore. He denies dyspnea. He denies PND or edema. He denies syncope.  He is concerned about Lipitor causing problems with diabetes. He was not aware that his hemoglobin A1c was abnormal from the hospital.   Studies:  - LHC (10/15/13):  LAD subtotal occluded, ostial to prox CFX 80%, mid CFX 90% then 80%, prox RCA80%, EF 30% with ant and apical HK.  PCI:  POBA to LAD.   Recent Labs: 10/15/2013: ALT 47; HDL Cholesterol by NMR 27*; LDL (calc) 152*  10/18/2013: Creatinine 0.97; Hemoglobin 9.6*; Potassium 3.9 10/15/2013: Hemoglobin-A1c 6.6*    Wt Readings from Last 3 Encounters:  01/04/14 197 lb 12.8 oz (89.721 kg)  11/14/13 207 lb (93.895 kg)  11/13/13 207 lb (93.895 kg)     Past Medical History  Diagnosis Date  . Crohn disease   . Acute myocardial infarction of other anterior wall, initial episode of care 10/15/2013  . Mixed hyperlipidemia   . Coronary artery disease 10/15/2013  . 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 Hx of echocardiogram     Echo (10/2013):  Mild LVH, EF 55-60%, mild AS hypokinesis    Current Outpatient Prescriptions  Medication Sig Dispense Refill  . aspirin EC 325 MG EC tablet Take 1 tablet (325 mg total) by mouth daily.      Marland Kitchen atorvastatin (LIPITOR) 80 MG tablet Take 80 mg by mouth daily.      Marland Kitchen co-enzyme Q-10 50 MG capsule Take 50 mg by mouth daily.      Marland Kitchen ezetimibe (ZETIA) 10 MG tablet Take 1 tablet (10 mg total) by mouth daily.  30 tablet  5  . lisinopril (PRINIVIL,ZESTRIL) 2.5 MG tablet Take 1 tablet (2.5 mg total) by mouth daily.  30 tablet  3  . metoprolol tartrate (LOPRESSOR) 12.5 mg TABS tablet Take 0.5 tablets (12.5 mg total) by mouth 2 (two) times daily.  60 each  1  . Multiple Vitamin (MULTI VITAMIN DAILY PO) Take 1 tablet by mouth daily.       No current facility-administered medications for this visit.    Allergies:   Fish allergy   Social History:  The patient  reports that he has never smoked. He has never used smokeless tobacco. He reports that he does not drink alcohol or use illicit drugs.  Family History:  The patient's family history includes Coronary artery disease (age of onset: 53) in his father.   ROS:  Please see the history of present illness.   Occasional fatigue. Mild dizziness with standing.  All other systems reviewed and negative.   PHYSICAL EXAM: VS:  BP 122/64  Pulse 61  Ht 6\' 4"  (1.93 m)  Wt 197 lb 12.8 oz (89.721 kg)  BMI 24.09 kg/m2 Well nourished, well developed, in no acute distress HEENT: normal Neck: no JVD Cardiac:  normal S1, S2; RRR; no murmur Chest: Median sternotomy wound well healed without erythema or discharge Lungs:  clear to auscultation bilaterally, no wheezing, rhonchi or rales Abd: soft, nontender, no hepatomegaly Ext: no edema Skin: warm and dry Neuro:  CNs 2-12 intact, no focal abnormalities noted      ASSESSMENT AND PLAN:  1. S/P Anterior STEMI 09/2013 tx'd with POBA of LAD => Emergent CABG: He is  progressing well after CABG. He is doing cardiac rehabilitation in [place]. Continue aspirin, statin, beta blocker, ACE inhibitor.  2. Coronary artery disease:  Continue aspirin, statin, beta blocker.  Decrease aspirin to 81 mg daily.  3. Ischemic cardiomyopathy: Continue beta blocker and ACE inhibitor. LV function was improved to 55-60% by echocardiogram  4. Mixed hyperlipidemia:  He has had a significant history of hyperlipidemia that is uncontrolled. His LDL was 150 on high dose Lipitor.Zetia added by lipid clinic. Declined PCSK9 trial.  On 6/22, LDL 132, TG 157, TC 191(a month after starting Zetia).  Will inform lipid clinic.   5. Diabetes: Hemoglobin A1c was 6.6 in the hospital.  Follow up with his primary care physician.  He has lost 20 lbs since surgery.  6. Fatigue: Seems to be improving.  Eat regularly.  He is still within 3 months of the surgery.  Mild orthostatic sx.  COunseled regarding avoiding sudden changes in position.  Check BP when he feels tired.  If it is too low, then he may need to come of of lisinopril.     Signed, Tereso Newcomer, PA-C  01/04/2014 8:28 AM

## 2014-01-04 NOTE — Patient Instructions (Signed)
Your physician recommends that you continue on your current medications as directed. Please refer to the Current Medication list given to you today.  Your physician wants you to follow-up in: 6 months with Dr. Varanasi.  You will receive a reminder letter in the mail two months in advance. If you don't receive a letter, please call our office to schedule the follow-up appointment.  

## 2014-01-09 ENCOUNTER — Encounter: Payer: Self-pay | Admitting: Cardiology

## 2014-01-09 ENCOUNTER — Telehealth: Payer: Self-pay | Admitting: Interventional Cardiology

## 2014-01-09 NOTE — Telephone Encounter (Signed)
Patient needs a work note to go back to work partial days then to 12 days. If any questions please call patient. He can be reached at [phone]. Please fax note to employer Attention: Royden Purl [phone]

## 2014-01-09 NOTE — Telephone Encounter (Signed)
Spoke with pt and he wants to go back starting July 20th for 1/2 days (6 hrs) and then go back to 12 hour shifts starting July 29th. 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

## 2014-01-09 NOTE — Telephone Encounter (Signed)
Letter created

## 2014-01-09 NOTE — Telephone Encounter (Signed)
OK to put the above request a note for me to sign.

## 2014-01-12 NOTE — Telephone Encounter (Signed)
Letter faxed.

## 2014-01-22 ENCOUNTER — Telehealth: Payer: Self-pay | Admitting: Interventional Cardiology

## 2014-01-22 NOTE — Telephone Encounter (Signed)
New message     Pt is due for a dental cleaning tomorrow-- he had open heart surgery 64mo ago.  Is it ok to have a dental cleaning?  Please fax clearance to [phone].

## 2014-01-22 NOTE — Telephone Encounter (Signed)
To Dr. Varanasi, please advise.  

## 2014-01-23 NOTE — Telephone Encounter (Signed)
Dr. Kalman Jewels office notified.

## 2014-01-23 NOTE — Telephone Encounter (Signed)
Ok for dental cleaning

## 2014-01-23 NOTE — Telephone Encounter (Signed)
Faxed to Dr. Gustavo Lah.

## 2014-01-31 ENCOUNTER — Other Ambulatory Visit: Payer: BC Managed Care – PPO

## 2014-02-08 ENCOUNTER — Ambulatory Visit (INDEPENDENT_AMBULATORY_CARE_PROVIDER_SITE_OTHER): Payer: BC Managed Care – PPO | Admitting: Pharmacist

## 2014-02-08 VITALS — Wt 199.0 lb

## 2014-02-08 DIAGNOSIS — E782 Mixed hyperlipidemia: Secondary | ICD-10-CM

## 2014-02-08 DIAGNOSIS — Z79899 Other long term (current) drug therapy: Secondary | ICD-10-CM

## 2014-02-08 MED ORDER — AMBULATORY NON FORMULARY MEDICATION: 75.0000 mg | Status: DC

## 2014-02-08 NOTE — Assessment & Plan Note (Addendum)
Patient's cholesterol has improved since adding Zetia 10 mg qd to lipitor 80 mg qd, however LDL remains 132 mg/dL in patient with CABG 4 months ago.  Given family history of CAD, elevated LDL, current premature CAD in patient, and bile acid sequestrants not being appropriate (GI / Crohn's),  would like to start patient on Praluent.  Taught patient how to administer Praluent today in office, and reviewed side effects, safety, administration, and storage.  He has private insurance, BCBS, so will submit paperwork and prescription to [employer] to get process started.  Given samples to last six weeks.  Will recheck cholesterol in 5 weeks, and see me the next day.  He agrees to call me if he starts having any issues or questions about medication.  Patient is excited that his option is finally available because he has seen what elevated cholesterol has done to both his father and now himself at an early age.   Plan: 1.  Continue Lipitor 80 mg once daily and Zetia 10 mg once daily. 2.  Start Praluent 75 mg subcutaneous injection into leg twice monthly (10th and 25th of the month for him) 3.  Recheck cholesterol in 5 weeks (03/13/14 - fasting labs), and see Kenneth Humphrey the next day on 03/14/14 at 8:30 am.

## 2014-02-08 NOTE — Patient Instructions (Addendum)
1.  Continue Lipitor 80 mg once daily and Zetia 10 mg once daily. 2.  Start Praluent 75 mg subcutaneous injection into leg twice monthly (10th and 25th of the month for him) 3.  Recheck cholesterol in 5 weeks (03/13/14 - fasting labs), and see Kenneth Humphrey the next day on 03/14/14 at 8:30 am.

## 2014-02-08 NOTE — Progress Notes (Signed)
Patient is a pleasant 53 y.o. WM referred to lipid clinic given recent MI and CABG (09/2013) while on Lipitor 80 mg qd.  His LDL was 152 mg/dL despite already taking Lipitor at time of CABG.  Zetia 10 mg qd was added to his lipitor 80 mg qd in 10/2013, and his LDL dropped to 132 mg/dL on this combination 5-6 weeks later.  He has been fighting elevated cholesterol his whole life, and saw his father do the same thing.  His father had an MI at age 87 y.o..  Patient has drastically changed his diet since his event in April, however he understands much further reduction in LDL is needed given his history.  Patient is interested in hearing more about PCSK-9 inhibitor agents.   Patient is walking 7 days per week now, for at least 2 miles per day.  His PCP has checked his cholesterol in the past, and started him on Lipitor over a year ago - will get his baseline LDL sent over.  Patient has a history of Crohn's disease and chronic constipation, so bile acid sequestrant not appropriate in patient.  Also, bile acid sequestrant will not be potent enough to get LDL to target goal.  RF:  MI, CABG (3 vessel), age, low HDL - LDL goal < 70 mg/dL, non-HDL goal < 710 mg/dL Meds:  Lipitor 80 mg qd, Zetia 10 mg qd  Diet:  Now eating eggs and toast, or cereal every morning.  Use to eat 4-5 eggs per day.  Lunch is typically chicken and a salad, and dinner is chicken or pork with vegetables.  He eats red meat only once weekly.  Is allergic to fish (GI upset and vomiting).  Use to drinks soda frequently, but since event, he has stopped these entirely.  Drinks unsweetened tea or water now.  Rarely eats desserts.  Has an apple or yogurt for snack. Exercise:  Walks 2-3 miles daily. Family history:  Father had an MI at 72 y.o..  He died of a brain aneurysm.  Was a heavy smoker.  Patient is an only child. Social history:  Patient doesn't smoke.  He lives in [place], so travels over an hour to come here.  Works near [employer]  though.  Labs:   12/18/13:  TC 191, TG 157, HDL 28, LDL 132 (Lipitor 80 mg qd + Zetia 10 mg qd) 09/2013:  TC 205, TG 131, HDL 27, LDL 152 (on Lipitor 80 mg qd)  Current Outpatient Prescriptions  Medication Sig Dispense Refill  . aspirin EC 325 MG EC tablet Take 1 tablet (325 mg total) by mouth daily.      Marland Kitchen atorvastatin (LIPITOR) 80 MG tablet Take 80 mg by mouth daily.      Marland Kitchen co-enzyme Q-10 50 MG capsule Take 50 mg by mouth daily.      Marland Kitchen ezetimibe (ZETIA) 10 MG tablet Take 1 tablet (10 mg total) by mouth daily.  30 tablet  5  . lisinopril (PRINIVIL,ZESTRIL) 2.5 MG tablet Take 1 tablet (2.5 mg total) by mouth daily.  30 tablet  3  . metoprolol tartrate (LOPRESSOR) 12.5 mg TABS tablet Take 0.5 tablets (12.5 mg total) by mouth 2 (two) times daily.  60 each  1  . Multiple Vitamin (MULTI VITAMIN DAILY PO) Take 1 tablet by mouth daily.       No current facility-administered medications for this visit.   Allergies  Allergen Reactions  . Fish Allergy Anaphylaxis   Family History  Problem Relation  Age of Onset  . Coronary artery disease Father 58

## 2014-02-15 ENCOUNTER — Other Ambulatory Visit: Payer: Self-pay | Admitting: Interventional Cardiology

## 2014-03-13 ENCOUNTER — Other Ambulatory Visit (INDEPENDENT_AMBULATORY_CARE_PROVIDER_SITE_OTHER): Payer: BC Managed Care – PPO

## 2014-03-13 DIAGNOSIS — E782 Mixed hyperlipidemia: Secondary | ICD-10-CM

## 2014-03-13 DIAGNOSIS — Z79899 Other long term (current) drug therapy: Secondary | ICD-10-CM

## 2014-03-13 LAB — HEPATIC FUNCTION PANEL
ALK PHOS: 98 U/L (ref 39–117)
ALT: 29 U/L (ref 0–53)
AST: 23 U/L (ref 0–37)
Albumin: 3.7 g/dL (ref 3.5–5.2)
Bilirubin, Direct: 0.1 mg/dL (ref 0.0–0.3)
TOTAL PROTEIN: 6.7 g/dL (ref 6.0–8.3)
Total Bilirubin: 0.5 mg/dL (ref 0.2–1.2)

## 2014-03-13 LAB — LIPID PANEL
CHOLESTEROL: 83 mg/dL (ref 0–200)
HDL: 24.4 mg/dL — AB (ref >39.00)
LDL Cholesterol: 44 mg/dL (ref 0–99)
NonHDL: 58.6
TRIGLYCERIDES: 74 mg/dL (ref 0.0–149.0)
Total CHOL/HDL Ratio: 3
VLDL: 14.8 mg/dL (ref 0.0–40.0)

## 2014-03-14 ENCOUNTER — Ambulatory Visit (INDEPENDENT_AMBULATORY_CARE_PROVIDER_SITE_OTHER): Payer: BC Managed Care – PPO | Admitting: Pharmacist

## 2014-03-14 VITALS — Wt 192.0 lb

## 2014-03-14 DIAGNOSIS — E782 Mixed hyperlipidemia: Secondary | ICD-10-CM

## 2014-03-14 DIAGNOSIS — Z79899 Other long term (current) drug therapy: Secondary | ICD-10-CM

## 2014-03-14 NOTE — Progress Notes (Signed)
Patient is a pleasant 53 y.o. WM here for f/u after adding Praluent 75 mg q 2 weeks to his established regimen of Lipitor 80 mg qd + Zetia 10 mg qd five weeks ago.  His LDL was 132 mg/dL on lipitor + Zetia in 07/8204, and today his LDL has dropped down to 44 mg/dL since adding Praluent 75 mg q 2 weeks.  He is tolerating regimen well without complaint.  He has been successfully enrolled into [facility] and is already receiving medication from the manufacturer while BCBS assesses their coverage of medication.  His LDL was 152 mg/dL despite already taking Lipitor at time of CABG.  He has been fighting elevated cholesterol his whole life, and saw his father do the same thing.  His father had an MI at age 42 y.o..  Patient had drastically changed his diet since his event in April, and over the past 3 months has changed his diet even further.  He has cut out processed foods and no longer eating out.  Has lost 7 lbs over past months.  Also walking 3-5 miles most days of the week.  Patient has a history of Crohn's disease and chronic constipation, so bile acid sequestrant not appropriate in patient.   RF:  MI, CABG (3 vessel), age, low HDL - LDL goal < 70 mg/dL, non-HDL goal < 015 mg/dL Meds:  Lipitor 80 mg qd, Zetia 10 mg qd, Praluent 75 mg q 2 weeks.  Diet:  Now eating eggs and toast, or cereal every morning.  Use to eat 4-5 eggs per day.  Lunch is typically chicken and a salad, and dinner is chicken or pork with vegetables.   Is allergic to fish (GI upset and vomiting).  Use to drinks soda frequently, but has stopped these entirely.  Drinks unsweetened tea or water now.  Rarely eats desserts.  Has an apple or yogurt for snack.  Never eats out anymore. Exercise:  Walks 3-5 miles daily. Family history:  Father had an MI at 37 y.o..  He died of a brain aneurysm.  Was a heavy smoker.  Patient is an only child. Social history:  Patient doesn't smoke.  He lives in [place], so travels over an hour to  come here.  Works near [employer] though.  Labs:   02/2014:  TC 83, TG 74, HDL 24, LDL 44, LFTs normal (Lipitor 80 mg qd, Zetia 10 mg qd, Praluent 75 mg q 2 weeks) 12/18/13:  TC 191, TG 157, HDL 28, LDL 132 (Lipitor 80 mg qd + Zetia 10 mg qd) 09/2013:  TC 205, TG 131, HDL 27, LDL 152 (on Lipitor 80 mg qd)  Current Outpatient Prescriptions  Medication Sig Dispense Refill  . Alirocumab (PRALUENT) 75 MG/ML SOPN Inject 75 mg into the skin every 14 (fourteen) days.      Marland Kitchen aspirin EC 325 MG EC tablet Take 1 tablet (325 mg total) by mouth daily.      Marland Kitchen atorvastatin (LIPITOR) 80 MG tablet Take 80 mg by mouth daily.      Marland Kitchen co-enzyme Q-10 50 MG capsule Take 50 mg by mouth daily.      Marland Kitchen ezetimibe (ZETIA) 10 MG tablet Take 1 tablet (10 mg total) by mouth daily.  30 tablet  5  . lisinopril (PRINIVIL,ZESTRIL) 2.5 MG tablet Take 1 tablet (2.5 mg total) by mouth daily.  30 tablet  3  . metoprolol tartrate (LOPRESSOR) 25 MG tablet take 1/2 tablet by mouth twice a day  60 tablet  1  . Multiple Vitamin (MULTI VITAMIN DAILY PO) Take 1 tablet by mouth daily.       No current facility-administered medications for this visit.   Allergies  Allergen Reactions  . Fish Allergy Anaphylaxis   Family History  Problem Relation Age of Onset  . Coronary artery disease Father 68

## 2014-03-14 NOTE — Assessment & Plan Note (Signed)
Excellent LDL reduction with addition of Praluent.  Still waiting for BCBS to officially approve Praluent which will likely be a few months still.  Patient has been enrolled into [employer] and will receive medication through the company until approved by [employer].  May be able to d/c zetia at that time, but will continue for now until approval has been granted by [facility].  Will recheck lipid / liver in 3 months and call patient with results.  Patient will call lipid clinic if he has problems with medication or gets word from [facility] about approval status.

## 2014-03-14 NOTE — Patient Instructions (Signed)
1.  Continue low fat diet and exercise regimen. 2.  Continue Praluent 75 mg twice monthly, continue lipitor 80 mg daily and Zetia 10 mg daily. 3.  Recheck cholesterol and liver function in 3 months (06/13/14 - fasting labs - show up anytime after 7:30 am)  [phone]  - Audrie Lia (since Riki Rusk is leaving)

## 2014-04-14 ENCOUNTER — Other Ambulatory Visit: Payer: Self-pay

## 2014-04-14 MED ORDER — LISINOPRIL 2.5 MG PO TABS: 2.5000 mg | ORAL_TABLET | Freq: Every day | ORAL | Status: DC

## 2014-06-07 ENCOUNTER — Encounter (HOSPITAL_COMMUNITY): Payer: Self-pay | Admitting: Interventional Cardiology

## 2014-06-11 ENCOUNTER — Other Ambulatory Visit: Payer: Self-pay | Admitting: *Deleted

## 2014-06-11 MED ORDER — EZETIMIBE 10 MG PO TABS: 10.0000 mg | ORAL_TABLET | Freq: Every day | ORAL | Status: DC

## 2014-06-13 ENCOUNTER — Other Ambulatory Visit: Payer: BC Managed Care – PPO

## 2014-06-14 ENCOUNTER — Other Ambulatory Visit (INDEPENDENT_AMBULATORY_CARE_PROVIDER_SITE_OTHER): Payer: BC Managed Care – PPO | Admitting: *Deleted

## 2014-06-14 DIAGNOSIS — E782 Mixed hyperlipidemia: Secondary | ICD-10-CM

## 2014-06-14 DIAGNOSIS — Z79899 Other long term (current) drug therapy: Secondary | ICD-10-CM

## 2014-06-14 LAB — HEPATIC FUNCTION PANEL
ALT: 42 U/L (ref 0–53)
AST: 29 U/L (ref 0–37)
Albumin: 4.1 g/dL (ref 3.5–5.2)
Alkaline Phosphatase: 100 U/L (ref 39–117)
BILIRUBIN DIRECT: 0 mg/dL (ref 0.0–0.3)
TOTAL PROTEIN: 6.9 g/dL (ref 6.0–8.3)
Total Bilirubin: 0.5 mg/dL (ref 0.2–1.2)

## 2014-06-14 LAB — LIPID PANEL
CHOL/HDL RATIO: 4
Cholesterol: 95 mg/dL (ref 0–200)
HDL: 26.1 mg/dL — ABNORMAL LOW (ref >39.00)
LDL Cholesterol: 52 mg/dL (ref 0–99)
NonHDL: 68.9
Triglycerides: 85 mg/dL (ref 0.0–149.0)
VLDL: 17 mg/dL (ref 0.0–40.0)

## 2014-06-18 ENCOUNTER — Telehealth: Payer: Self-pay | Admitting: Interventional Cardiology

## 2014-06-18 NOTE — Telephone Encounter (Signed)
New Msg    Pt would like to discuss his medication Praluent, 75mg  twice monthly. ,   States that he normally sees [employer] and he would like to speak with Kennon Rounds next.  Please contact pt [phone].

## 2014-06-18 NOTE — Telephone Encounter (Signed)
Spoke with pt.  He is currently in the [place] program but did not get his shipment for his dose this week.  He will come by the office tomorrow to pick up a dose.  He also informed me that his insurance will be changing in January to [facility].  He does not have a copy of his new card yet but will bring a copy by ASAP so we can restart his paperwork again.

## 2014-06-18 NOTE — Telephone Encounter (Signed)
Spoke with pt.  Labs are excellent.  Continue current therapy.

## 2014-06-18 NOTE — Telephone Encounter (Signed)
I left a detailed message of the patient's results on his identified voice mail. Will forward the message to Kennon Rounds to please review as the patient is on Pralulent.

## 2014-06-18 NOTE — Telephone Encounter (Signed)
New Msg    Pt calling would like results of labs, states he would like results left on the vm of this number [phone].

## 2014-06-28 ENCOUNTER — Other Ambulatory Visit: Payer: Self-pay | Admitting: *Deleted

## 2014-06-28 MED ORDER — METOPROLOL TARTRATE 25 MG PO TABS: 12.5000 mg | ORAL_TABLET | Freq: Two times a day (BID) | ORAL | Status: DC

## 2014-06-28 MED ORDER — EZETIMIBE 10 MG PO TABS: 10.0000 mg | ORAL_TABLET | Freq: Every day | ORAL | Status: DC

## 2014-07-23 ENCOUNTER — Telehealth: Payer: Self-pay | Admitting: Interventional Cardiology

## 2014-07-23 ENCOUNTER — Ambulatory Visit: Payer: BC Managed Care – PPO | Admitting: Interventional Cardiology

## 2014-07-23 ENCOUNTER — Telehealth: Payer: Self-pay | Admitting: Pharmacist

## 2014-07-23 NOTE — Telephone Encounter (Signed)
New message      Talk to Kennon Rounds regarding getting praluent approved with ins co. He is out of medication

## 2014-07-23 NOTE — Telephone Encounter (Signed)
New message      Pt has a head cold.  What can he take?

## 2014-07-23 NOTE — Telephone Encounter (Signed)
I spoke with the patient's wife- recommended plain claritin or coricidn HBP, humidifier, nasal saline spray, and hydration for treatment of symptoms.

## 2014-07-24 NOTE — Telephone Encounter (Signed)
Spoke with pt.  He came to office today to pick up samples.

## 2014-07-24 NOTE — Telephone Encounter (Signed)
F/u ° ° °Pt calling concerning previous message. Please call pt.  °

## 2014-08-20 ENCOUNTER — Other Ambulatory Visit: Payer: Self-pay | Admitting: *Deleted

## 2014-08-20 ENCOUNTER — Ambulatory Visit (INDEPENDENT_AMBULATORY_CARE_PROVIDER_SITE_OTHER): Payer: 59 | Admitting: Interventional Cardiology

## 2014-08-20 ENCOUNTER — Encounter: Payer: Self-pay | Admitting: Interventional Cardiology

## 2014-08-20 VITALS — BP 116/68 | HR 56 | Ht 76.0 in | Wt 191.0 lb

## 2014-08-20 DIAGNOSIS — I2510 Atherosclerotic heart disease of native coronary artery without angina pectoris: Secondary | ICD-10-CM

## 2014-08-20 DIAGNOSIS — E782 Mixed hyperlipidemia: Secondary | ICD-10-CM

## 2014-08-20 DIAGNOSIS — Z951 Presence of aortocoronary bypass graft: Secondary | ICD-10-CM

## 2014-08-20 DIAGNOSIS — R7302 Impaired glucose tolerance (oral): Secondary | ICD-10-CM | POA: Insufficient documentation

## 2014-08-20 MED ORDER — NITROGLYCERIN 0.4 MG SL SUBL: 0.4000 mg | SUBLINGUAL_TABLET | SUBLINGUAL | Status: DC | PRN

## 2014-08-20 MED ORDER — METOPROLOL TARTRATE 25 MG PO TABS: 12.5000 mg | ORAL_TABLET | Freq: Two times a day (BID) | ORAL | Status: DC

## 2014-08-20 MED ORDER — LISINOPRIL 2.5 MG PO TABS: 2.5000 mg | ORAL_TABLET | Freq: Every day | ORAL | Status: DC

## 2014-08-20 MED ORDER — EZETIMIBE 10 MG PO TABS: 10.0000 mg | ORAL_TABLET | Freq: Every day | ORAL | Status: DC

## 2014-08-20 NOTE — Progress Notes (Signed)
Patient ID: Kenneth Humphrey, male   DOB: 08/18/1960, 54 y.o.   MRN: [id number]    [address] [place], [place]  [address] Phone: [phone] Fax:  [phone]  Date:  08/20/2014   ID:  Kenneth Humphrey, DOB 28-Oct-1960, MRN [id number]  PCP:  Sheran Fava, MD      History of Present Illness: Kenneth Humphrey is a 54 y.o. male with a hx of HL, Crohn's disease. He was admitted 4/19-4/23 with an anterior STEMI. Emergent cath demonstrated 3 v CAD with a subtotally occluded LAD. The LAD was treated with POBA. EF was 30% by LV gram. He was then take for emergent CABG with Dr. Cornelius Moras (L-LAD, S-PDA, S-RI, S-OM). Records indicate LV function was normal by intraoperative TEE. Post op course was fairly uneventful. He remained in NSR.  He returns for follow up. He is doing well. He is up to walking 2-1/2 to 4 miles per day on days off, and finished cardiac rehab.  He denies dyspnea. He denies PND or edema. He denies syncope. One episode of chest pain with mental stress on a difficult day at work.  No exertional sx.  BP is in the 115/65 range.  Doing well on Praluent which was started in 8/15.  His Praluent was approved by [facility].  He is unsure if he will get this med from the [employer] program.    Wt Readings from Last 3 Encounters:  08/20/14 191 lb (86.637 kg)  03/14/14 192 lb (87.091 kg)  02/08/14 199 lb (90.266 kg)     Past Medical History  Diagnosis Date  . Crohn disease   . Acute myocardial infarction of other anterior wall, initial episode of care 10/15/2013  . Mixed hyperlipidemia   . Coronary artery disease 10/15/2013  . 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 Hx of echocardiogram     Echo (10/2013):  Mild LVH, EF 55-60%, mild AS hypokinesis    Current Outpatient Prescriptions  Medication Sig Dispense Refill  . Alirocumab (PRALUENT) 75 MG/ML SOPN Inject 75 mg into the skin every 14  (fourteen) days.    Marland Kitchen aspirin 81 MG tablet Take 81 mg by mouth daily.    Marland Kitchen atorvastatin (LIPITOR) 80 MG tablet Take 80 mg by mouth daily.    Marland Kitchen co-enzyme Q-10 50 MG capsule Take 50 mg by mouth daily.    Marland Kitchen ezetimibe (ZETIA) 10 MG tablet Take 1 tablet (10 mg total) by mouth daily. 30 tablet 12  . lisinopril (PRINIVIL,ZESTRIL) 2.5 MG tablet Take 1 tablet (2.5 mg total) by mouth daily. 30 tablet 3  . metoprolol tartrate (LOPRESSOR) 25 MG tablet Take 0.5 tablets (12.5 mg total) by mouth 2 (two) times daily. 60 tablet 6  . Multiple Vitamin (MULTI VITAMIN DAILY PO) Take 1 tablet by mouth daily.     No current facility-administered medications for this visit.    Allergies:    Allergies  Allergen Reactions  . Fish Allergy Anaphylaxis    Social History:  The patient  reports that he has never smoked. He has never used smokeless tobacco. He reports that he does not drink alcohol or use illicit drugs.   Family History:  The patient's family history includes Cancer in his mother; Coronary artery disease (age of onset: 33) in his father.   ROS:  Please see the history of present illness.  No nausea, vomiting.  No fevers, chills.  No focal weakness.  No dysuria. All other systems reviewed and negative.   PHYSICAL EXAM: VS:  BP 116/68 mmHg  Pulse 56  Ht 6\' 4"  (1.93 m)  Wt 191 lb (86.637 kg)  BMI 23.26 kg/m2 General: Well developed, well nourished, in no acute distress HEENT: normal Neck: no JVD, no carotid bruits Cardiac:  normal S1, S2; RRR;  Lungs:  clear to auscultation bilaterally, no wheezing, rhonchi or rales Abd: soft, nontender, no hepatomegaly Ext: tr edema, 2+ bilateral PT pulses Skin: warm and dry Neuro:   no focal abnormalities noted Psych: normal affect  EKG:  NSR, no ST segment changes    ASSESSMENT AND PLAN:   1. S/P Anterior STEMI 09/2013 tx'd with POBA of LAD => Emergent CABG: Continue aspirin, statin, beta blocker, ACE inhibitor, Praluent  2. Coronary artery  disease: Continue aspirin, statin, beta blocker. Decreased aspirin to 81 mg daily.  One episode of chest pain was very atypical.  WOuld not plan any ischemic w/u unless he has any exertional sx.  NTG rx called in. 90 day supply of cardiac med called in.  3. Ischemic cardiomyopathy: Continue beta blocker and ACE inhibitor. LV function was improved to 55-60% by echocardiogram at most recent check.  4. Mixed hyperlipidemia: He has had a significant history of hyperlipidemia that is uncontrolled. His LDL was 150 on high dose Lipitor.Zetia added by lipid clinic. Better on PCSK9 inhibitor.Will check with lipid clinic about getting next dose and getting 90 days supply.  He would like to try to come off of Lipitor as well.  5. Diabetes: Hemoglobin A1c was 6.6 in the hospital. Following up with his primary care physician. He has maintainined weight loss since surgery.  6. Fatigue: Resolved. Mild orthostatic sx resolved.   Signed, Fredric Mare, MD, [facility] 08/20/2014 8:33 AM

## 2014-08-20 NOTE — Patient Instructions (Signed)
Your physician recommends that you continue on your current medications as directed. Please refer to the Current Medication list given to you today.  Your physician wants you to follow-up in: 6 months with Dr. Varanasi.  You will receive a reminder letter in the mail two months in advance. If you don't receive a letter, please call our office to schedule the follow-up appointment.  

## 2014-08-21 ENCOUNTER — Other Ambulatory Visit: Payer: Self-pay

## 2014-08-21 MED ORDER — LISINOPRIL 2.5 MG PO TABS: 2.5000 mg | ORAL_TABLET | Freq: Every day | ORAL | Status: DC

## 2014-10-15 ENCOUNTER — Encounter: Payer: Self-pay | Admitting: Thoracic Surgery (Cardiothoracic Vascular Surgery)

## 2014-10-15 ENCOUNTER — Ambulatory Visit (INDEPENDENT_AMBULATORY_CARE_PROVIDER_SITE_OTHER): Payer: 59 | Admitting: Thoracic Surgery (Cardiothoracic Vascular Surgery)

## 2014-10-15 VITALS — BP 97/60 | HR 60 | Resp 16 | Ht 76.0 in | Wt 190.0 lb

## 2014-10-15 DIAGNOSIS — Z951 Presence of aortocoronary bypass graft: Secondary | ICD-10-CM

## 2014-10-15 DIAGNOSIS — I2510 Atherosclerotic heart disease of native coronary artery without angina pectoris: Secondary | ICD-10-CM | POA: Diagnosis not present

## 2014-10-15 NOTE — Progress Notes (Signed)
[address]       [name] [address]             [phone]     CARDIOTHORACIC SURGERY OFFICE NOTE  Referring Provider is Corky Crafts, MD PCP is Sheran Fava, MD   HPI:  Patient returns for routine followup status post coronary artery bypass grafting x4 on 10/15/2013. He presented with an acute evolving ST segment elevation myocardial infarction. He was initially treated with primary balloon angioplasty of the culprit vessel then subsequently taken directly to surgery for urgent revascularization. His postoperative recovery was uneventful and he was last seen here in our office on 11/13/2013 at which time he was doing very well.  He underwent follow-up echocardiogram on 11/23/2013 revealed essentially normal left ventricular systolic function with ejection fraction estimated 55-60%.  He completed cardiac rehabilitation program and has continued to do exceptionally well. He was seen in follow-up by Dr. Eldridge Dace on 08/20/2014 and he returns to our office for routine follow-up today.  He states that he feels much better than he did for several months leading up to his heart attack and surgery. He remains very active physically and walks at least 20 miles every week. He reports no symptoms of exertional shortness of breath or chest discomfort whatsoever. He reports no physical limitations. He has lost weight, embraced a very healthy diet and physical lifestyle, and overall continues to feel exceptionally well.   Current Outpatient Prescriptions  Medication Sig Dispense Refill  . Alirocumab (PRALUENT) 75 MG/ML SOPN Inject 75 mg into the skin every 14 (fourteen) days.    Marland Kitchen aspirin 81 MG tablet Take 81 mg by mouth daily.    Marland Kitchen atorvastatin (LIPITOR) 80 MG tablet Take 80 mg by mouth daily.    Marland Kitchen co-enzyme Q-10 50 MG capsule Take 100 mg by mouth daily.     Marland Kitchen lisinopril (PRINIVIL,ZESTRIL) 2.5 MG tablet Take 1 tablet (2.5 mg total) by mouth daily. 30 tablet 3    . metoprolol tartrate (LOPRESSOR) 25 MG tablet Take 0.5 tablets (12.5 mg total) by mouth 2 (two) times daily. 180 tablet 3  . Multiple Vitamin (MULTI VITAMIN DAILY PO) Take 1 tablet by mouth daily.    . nitroGLYCERIN (NITROSTAT) 0.4 MG SL tablet Place 1 tablet (0.4 mg total) under the tongue every 5 (five) minutes as needed for chest pain. 25 tablet 6  . ezetimibe (ZETIA) 10 MG tablet Take 1 tablet (10 mg total) by mouth daily. 90 tablet 3   No current facility-administered medications for this visit.      Physical Exam:   BP 97/60 mmHg  Pulse 60  Resp 16  Ht 6\' 4"  (1.93 m)  Wt 190 lb (86.183 kg)  BMI 23.14 kg/m2  SpO2 98%  General:  Well-appearing  Chest:   Clear  CV:   Regular rate and rhythm without murmur  Incisions:  Completely healed, sternum is stable  Abdomen:  Soft and nontender  Extremities:  Warm and well-perfused  Diagnostic Tests:  Transthoracic Echocardiography  Patient:  Kenneth, Humphrey MR #:    [id number] Study Date: 11/23/2013 Gender:   M Age:    54 Height:   193 cm Weight:   93.9 kg BSA:    2.25 m^2 Pt. Status: Room:  Doreene Adas, Maryln Gottron T ATTENDING  Zoila Shutter MD SONOGRAPHER [facility] REFERRING  Lance Muss S PERFORMING  [facility], Outpatient  cc:  ------------------------------------------------------------------- LV EF:  55% -  60%  ------------------------------------------------------------------- Indications:   CAD of native vessels 414.01. Cardiomyopathy - ischemic 414.8.  ------------------------------------------------------------------- History:  PMH:  Chest pain. Acute myocardial infarction. Risk factors: Dyslipidemia.  ------------------------------------------------------------------- Study Conclusions  - Left ventricle: The cavity size was normal. There was mild concentric hypertrophy. Systolic function was normal. The estimated  ejection fraction was in the range of 55% to 60%. Mild anteroseptal hypokinesis. The study is not technically sufficient to allow evaluation of LV diastolic function. - Left atrium: The atrium was normal in size.  Impressions:  - LVEF 55-60%, mild anteroseptal hypokinesis, indeterminate diastolic function, normal LA Size.  ------------------------------------------------------------------- Labs, prior tests, procedures, and surgery: Coronary artery bypass grafting.  -------------------------------------------------------------------  ------------------------------------------------------------------- Left ventricle: The cavity size was normal. There was mild concentric hypertrophy. Systolic function was normal. The estimated ejection fraction was in the range of 55% to 60%. Mild anteroseptal hypokinesis. The study is not technically sufficient to allow evaluation of LV diastolic function.  ------------------------------------------------------------------- Aortic valve:  Structurally normal valve. Trileaflet. Cusp separation was normal. Doppler: Transvalvular velocity was within the normal range. There was no stenosis. There was no regurgitation.  ------------------------------------------------------------------- Aorta: Aortic root: The aortic root was normal in size. Ascending aorta: The ascending aorta was normal in size.  ------------------------------------------------------------------- Mitral valve:  Structurally normal valve.  Leaflet separation was normal. Doppler: Transvalvular velocity was within the normal range. There was no evidence for stenosis. There was no significant regurgitation.  Peak gradient (D): 3 mm Hg.  ------------------------------------------------------------------- Left atrium: The atrium was normal in size.  ------------------------------------------------------------------- Atrial septum: No defect or patent foramen ovale  was identified.  ------------------------------------------------------------------- Right ventricle: The cavity size was normal. Wall thickness was normal. Systolic function was normal.  ------------------------------------------------------------------- Pulmonic valve:  The valve appears to be grossly normal. Doppler: There was no significant regurgitation.  ------------------------------------------------------------------- Tricuspid valve:  Doppler: There was trivial regurgitation.  ------------------------------------------------------------------- Pulmonary artery:  The main pulmonary artery was normal-sized.  ------------------------------------------------------------------- Right atrium: The atrium was normal in size.  ------------------------------------------------------------------- Pericardium: There was no pericardial effusion.  ------------------------------------------------------------------- Systemic veins: Inferior vena cava: The vessel was normal in size. The respirophasic diameter changes were in the normal range (>= 50%), consistent with normal central venous pressure.  ------------------------------------------------------------------- Prepared and Electronically Authenticated by  Zoila Shutter MD 2015-05-28T17:08:33  ------------------------------------------------------------------- Measurements  Left ventricle               Value    Reference LV ID, ED, PLAX chordal       (L)   42.1 mm   43 - 52 LV ID, ES, PLAX chordal       (N)   28.7 mm   23 - 38 LV fx shortening, PLAX chordal   (N)   32  %   >=29 LV PW thickness, ED             11.4 mm   --------- IVS/LV PW ratio, ED         (N)   0.72     <=1.3 Stroke volume, 2D              55  ml   --------- Stroke volume/bsa, 2D            24  ml/m^2 --------- LV e&', lateral                15.7 cm/s  --------- LV E/e&', lateral              5.34     ---------  LV e&', medial                7.46 cm/s  --------- LV E/e&', medial               11.25    --------- LV e&', average               11.58 cm/s  --------- LV E/e&', average              7.25     ---------  Ventricular septum             Value    Reference IVS thickness, ED              8.26 mm   ---------  LVOT                    Value    Reference LVOT ID, S                 21  mm   --------- LVOT area                  3.46 cm^2  --------- LVOT ID                   21  mm   --------- LVOT VTI, S                 15.8 cm   --------- Stroke volume (SV), LVOT DP         54.7 ml   --------- Stroke index (SV/bsa), LVOT DP       24.3 ml/m^2 ---------  Aorta                    Value    Reference Aortic root ID, ED             36  mm   ---------  Left atrium                 Value    Reference LA ID, A-P, ES               36  mm   --------- LA ID/bsa, A-P           (N)   1.6  cm/m^2 <=2.2  Mitral valve                Value    Reference Mitral E-wave peak velocity         83.9 cm/s  --------- Mitral A-wave peak velocity         56.8 cm/s  --------- Mitral deceleration time      (N)   218  ms   150 - 230 Mitral peak gradient, D           3   mm Hg --------- Mitral E/A ratio, peak           1.5     --------- Mitral maximal regurg velocity,       208  cm/s  --------- PISA  Pulmonary arteries             Value    Reference PA  pressure, S, DP         (N)   26  mm Hg <=30  Tricuspid valve               Value    Reference Tricuspid regurg peak velocity  239  cm/s  --------- Tricuspid peak RV-RA gradient        23  mm Hg --------- Tricuspid maximal regurg velocity,     239  cm/s  --------- PISA  Right ventricle               Value    Reference RV s&', lateral, S              12.3 cm/s  ---------  Legend: (L) and (H) mark values outside specified reference range.  (N) marks values inside specified reference range.   Impression:  Patient is doing exceptionally well approximately one year following coronary artery bypass grafting x4.  He reports normal exercise tolerance without any symptoms of exertional shortness of breath or chest discomfort.  He remains on appropriate medical therapy and he has embraced a very healthy diet and physical lifestyle.    Plan:  In the future the patient will call and return to see [place] as needed.   I spent in excess of 15 minutes during the conduct of this office consultation and >50% of this time involved direct face-to-face encounter with the patient for counseling and/or coordination of their care.    Salvatore Decent. Cornelius Moras, MD 10/15/2014 4:12 PM

## 2015-02-18 ENCOUNTER — Other Ambulatory Visit: Payer: Self-pay | Admitting: Pharmacist

## 2015-02-18 DIAGNOSIS — E782 Mixed hyperlipidemia: Secondary | ICD-10-CM

## 2015-02-18 MED ORDER — ALIROCUMAB 75 MG/ML ~~LOC~~ SOPN: 75.0000 mg | PEN_INJECTOR | SUBCUTANEOUS | Status: DC

## 2015-02-20 ENCOUNTER — Other Ambulatory Visit: Payer: Self-pay | Admitting: Pharmacist

## 2015-02-20 MED ORDER — ALIROCUMAB 75 MG/ML ~~LOC~~ SOPN: 75.0000 mg | PEN_INJECTOR | SUBCUTANEOUS | Status: DC

## 2015-06-05 ENCOUNTER — Encounter: Payer: Self-pay | Admitting: Physician Assistant

## 2015-06-05 ENCOUNTER — Ambulatory Visit (INDEPENDENT_AMBULATORY_CARE_PROVIDER_SITE_OTHER): Payer: 59 | Admitting: Physician Assistant

## 2015-06-05 VITALS — BP 106/62 | HR 76 | Ht 76.0 in | Wt 191.8 lb

## 2015-06-05 DIAGNOSIS — E785 Hyperlipidemia, unspecified: Secondary | ICD-10-CM

## 2015-06-05 DIAGNOSIS — I255 Ischemic cardiomyopathy: Secondary | ICD-10-CM

## 2015-06-05 DIAGNOSIS — Z79899 Other long term (current) drug therapy: Secondary | ICD-10-CM | POA: Diagnosis not present

## 2015-06-05 MED ORDER — LISINOPRIL 2.5 MG PO TABS: 2.5000 mg | ORAL_TABLET | Freq: Every day | ORAL | Status: DC

## 2015-06-05 MED ORDER — ATORVASTATIN CALCIUM 40 MG PO TABS: 40.0000 mg | ORAL_TABLET | Freq: Every day | ORAL | Status: DC

## 2015-06-05 MED ORDER — METOPROLOL TARTRATE 25 MG PO TABS: 12.5000 mg | ORAL_TABLET | Freq: Two times a day (BID) | ORAL | Status: DC

## 2015-06-05 MED ORDER — EZETIMIBE 10 MG PO TABS: 10.0000 mg | ORAL_TABLET | Freq: Every day | ORAL | Status: DC

## 2015-06-05 NOTE — Progress Notes (Signed)
Cardiology Office Note   Date:  06/05/2015   ID:  Kenneth Humphrey, DOB 09/16/1960, MRN [id number]  PCP:  Sheran Fava, MD  Cardiologist:  Dr Jeanette Caprice, PA-C   Chief Complaint  Patient presents with  . Advice Only    patient complains of his feet feeling cold. no other problems since gallblader sugery.    History of Present Illness: Kenneth Humphrey is a 54 y.o. male with a history of STEMI 09/2013 w/ POBA LAD> CABG w/ L-LAD, S-PDA, S-RI-OM, EF 30%, Crohn's dz, HL. EF improved to 55% by 10/2013  Kenneth Humphrey presents for followup.   Kenneth Humphrey never gets chest pain. He has no problems with edema, DOE, orthopnea or PND. He exercises regularly and is compliant with his medications. He has not gained any weight. He has occasional MS pain that he feels is from the statin, but still is taking it.   He had abdominal pain after Thanksgiving that resolved and he did not seek treatment. However, a week later the pain returned and he went to [facility]. They diagnosed him with cholecystitis and he had a cholecystectomy. They requested he follow up with cardiology and he is here today.  Since the surgery, he has done very well. He still has a lot of soreness in his abdominal area, but no signs of infection. He has gradually increasing his activity and starting back walking. He has not yet returned to work and admits that he would not be able to do the physical exertion required by his job yet because his abdomen is still very sore.   Past Medical History  Diagnosis Date  . Crohn disease (HCC)   . Acute myocardial infarction of other anterior wall, initial episode of care 10/15/2013  . Mixed hyperlipidemia   . Coronary artery disease 10/15/2013  . 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 Hx of echocardiogram     Echo (10/2013):  Mild LVH, EF 55-60%, mild AS hypokinesis    Past Surgical  History  Procedure Laterality Date  . Hernia repair    . Coronary artery bypass graft N/A 10/15/2013    Procedure: CORONARY ARTERY BYPASS GRAFTING (CABG);  Surgeon: [name], MD;  Location: [facility] OR;  Service: Open Heart Surgery;  Laterality: N/A;  CABG x4  . Tee without cardioversion N/A 10/15/2013    Procedure: TRANSESOPHAGEAL ECHOCARDIOGRAM (TEE);  Surgeon: [name], MD;  Location: [facility] OR;  Service: Open Heart Surgery;  Laterality: N/A;  . Left heart cath N/A 10/15/2013    Procedure: LEFT HEART CATH;  Surgeon: Corky Crafts, MD;  Location: [facility] CATH LAB;  Service: Cardiovascular;  Laterality: N/A;    Current Outpatient Prescriptions  Medication Sig Dispense Refill  . Alirocumab (PRALUENT) 75 MG/ML SOPN Inject 75 mg into the skin every 14 (fourteen) days. 2 pen 11  . aspirin 81 MG tablet Take 81 mg by mouth daily.    Marland Kitchen atorvastatin (LIPITOR) 80 MG tablet Take 80 mg by mouth daily.    Marland Kitchen co-enzyme Q-10 50 MG capsule Take 100 mg by mouth daily.     Marland Kitchen ezetimibe (ZETIA) 10 MG tablet Take 1 tablet (10 mg total) by mouth daily. 90 tablet 3  . lisinopril (PRINIVIL,ZESTRIL) 2.5 MG tablet Take 1 tablet (2.5 mg total) by mouth daily. 30 tablet 3  . metoprolol tartrate (LOPRESSOR)  25 MG tablet Take 0.5 tablets (12.5 mg total) by mouth 2 (two) times daily. 180 tablet 3  . Multiple Vitamin (MULTI VITAMIN DAILY PO) Take 1 tablet by mouth daily.    . nitroGLYCERIN (NITROSTAT) 0.4 MG SL tablet Place 1 tablet (0.4 mg total) under the tongue every 5 (five) minutes as needed for chest pain. 25 tablet 6   No current facility-administered medications for this visit.    Allergies:   Fish allergy    Social History:  The patient  reports that he has never smoked. He has never used smokeless tobacco. He reports that he does not drink alcohol or use illicit drugs.   Family History:  The patient's family history includes Cancer in his mother; Coronary artery disease (age of onset: 6) in his father.      ROS:  Please see the history of present illness. All other systems are reviewed and negative.    PHYSICAL EXAM: VS:  BP 106/62 mmHg  Pulse 76  Ht 6\' 4"  (1.93 m)  Wt 191 lb 12.8 oz (87 kg)  BMI 23.36 kg/m2  SpO2 97% , BMI Body mass index is 23.36 kg/(m^2). GEN: Well nourished, well developed, male in no acute distress HEENT: normal for age  Neck: no JVD, no carotid bruit, no masses Cardiac: RRR; no murmur, no rubs, or gallops Respiratory:  clear to auscultation bilaterally, normal work of breathing GI: soft, nontender, nondistended, + BS MS: no deformity or atrophy; no edema; distal pulses are 2+ in all 4 extremities  Skin: warm and dry, no rash Neuro:  Strength and sensation are intact Psych: euthymic mood, full affect   EKG:  EKG is not ordered today.  Recent Labs: 06/14/2014: ALT 42    Lipid Panel    Component Value Date/Time   CHOL 95 06/14/2014 0734   TRIG 85.0 06/14/2014 0734   HDL 26.10* 06/14/2014 0734   CHOLHDL 4 06/14/2014 0734   VLDL 17.0 06/14/2014 0734   LDLCALC 52 06/14/2014 0734     Wt Readings from Last 3 Encounters:  06/05/15 191 lb 12.8 oz (87 kg)  10/15/14 190 lb (86.183 kg)  08/20/14 191 lb (86.637 kg)     Other studies Reviewed: Additional studies/ records that were reviewed today include: Hospital records and previous office notes.  ASSESSMENT AND PLAN:  1.  History of CAD: He has no ongoing ischemic symptoms. However, he has a history of ischemic cardiomyopathy with an EF previously 30%. He is encouraged to continue his heart-healthy lifestyle and resume exercise as he is able. He has no cardiac restrictions to return to work. We will update his echocardiogram to make sure his EF is remaining normal. We'll refill his prescriptions for his beta blocker, ACE inhibitor and nitroglycerin.  2. Hyperlipidemia: He is currently onset he had, Lipitor 80, coenzyme Q10 and PSK 9. Belenda Cruise was consulted and will follow his lipids on these  medications. However, because of possible myalgias, we will decrease the Lipitor to 40 mg daily and obtain a lipid profile and LFTs in one month.   Current medicines are reviewed at length with the patient today.  The patient does not have concerns regarding medicines.  The following changes have been made:  Decrease Lipitor  Labs/ tests ordered today include:   lipid profile, hepatic function profile and echocardiogram   Disposition:   FU with Dr. Abe People in a year  Signed, Leanna Battles  06/05/2015 8:10 AM    [facility] [address], [place], [place]  [address] Phone: [phone]; Fax: [phone]

## 2015-06-05 NOTE — Patient Instructions (Addendum)
Your physician has requested that you have an echocardiogram. Echocardiography is a painless test that uses sound waves to create images of your heart. It provides your doctor with information about the size and shape of your heart and how well your heart's chambers and valves are working. This procedure takes approximately one hour. There are no restrictions for this procedure.  Your physician recommends that you return for lab work in: 1 month fasting.  Your physician has recommended you make the following change in your medication: the atorvastatin has been decreased from 80 mg daily to 40 mg daily ( 1/2 tablet)  Your physician wants you to follow-up in: 1 year or sooner if needed with Dr. Lyn Hollingshead will receive a reminder letter in the mail two months in advance. If you don't receive a letter, please call our office to schedule the follow-up appointment.

## 2015-06-10 ENCOUNTER — Other Ambulatory Visit: Payer: Self-pay | Admitting: *Deleted

## 2015-06-10 MED ORDER — ATORVASTATIN CALCIUM 40 MG PO TABS: 40.0000 mg | ORAL_TABLET | Freq: Every day | ORAL | Status: DC

## 2015-06-10 NOTE — Telephone Encounter (Signed)
[employer] [facility] required physician authorization for refill beyond mid level provider.

## 2015-06-19 ENCOUNTER — Other Ambulatory Visit: Payer: Self-pay

## 2015-06-19 ENCOUNTER — Ambulatory Visit (HOSPITAL_COMMUNITY): Payer: 59 | Attending: Cardiology

## 2015-06-19 DIAGNOSIS — I255 Ischemic cardiomyopathy: Secondary | ICD-10-CM | POA: Insufficient documentation

## 2015-07-17 ENCOUNTER — Telehealth: Payer: Self-pay | Admitting: Interventional Cardiology

## 2015-07-17 NOTE — Telephone Encounter (Signed)
Sent PA reauthorization via Covermymeds.

## 2015-07-17 NOTE — Telephone Encounter (Signed)
Returned call to pt. He states he has 7 refills remaining at [facility], but the insurance is requiring "reauthorization." He was given a number for the dr to call to authorize [id number] option 2.   [employer], spoke to [place]. She will fax over forms. She also stated that he will need a lipid panel within 30 days.

## 2015-07-17 NOTE — Telephone Encounter (Signed)
LMOM - we sent in Praluent in August 2016 with 11 refills on it, he should not be out. Called to ask if pt switched insurance plans for 2017 as this could be why he has not received his Praluent yet. Will await phone call back.

## 2015-07-17 NOTE — Telephone Encounter (Signed)
New message       *STAT* If patient is at the pharmacy, call can be transferred to refill team.   1. Which medications need to be refilled? (please list name of each medication and dose if known) praluent 75mg  2. Which pharmacy/location (including street and city if local pharmacy) is medication to be sent to? [phone] [facility] [place]  3. Do they need a 30 day or 90 day supply? 30 day supply

## 2015-07-22 ENCOUNTER — Other Ambulatory Visit: Payer: Self-pay | Admitting: Pharmacist

## 2015-07-22 ENCOUNTER — Telehealth: Payer: Self-pay | Admitting: Pharmacist

## 2015-07-22 MED ORDER — ALIROCUMAB 75 MG/ML ~~LOC~~ SOPN: 75.0000 mg | PEN_INJECTOR | SUBCUTANEOUS | Status: DC

## 2015-07-22 NOTE — Telephone Encounter (Signed)
LMOM - Praluent re-approved by insurance, new rx sent in.

## 2015-08-02 ENCOUNTER — Other Ambulatory Visit: Payer: Self-pay

## 2015-08-02 LAB — HEPATIC FUNCTION PANEL
ALBUMIN: 4.1 g/dL (ref 3.6–5.1)
ALK PHOS: 93 U/L (ref 40–115)
ALT: 26 U/L (ref 9–46)
AST: 22 U/L (ref 10–35)
BILIRUBIN TOTAL: 0.4 mg/dL (ref 0.2–1.2)
Bilirubin, Direct: 0.1 mg/dL (ref <=0.2)
Indirect Bilirubin: 0.3 mg/dL (ref 0.2–1.2)
Total Protein: 6.4 g/dL (ref 6.1–8.1)

## 2015-08-02 LAB — LIPID PANEL
Cholesterol: 91 mg/dL — ABNORMAL LOW (ref 125–200)
HDL: 33 mg/dL — AB (ref >=40)
LDL Cholesterol: 47 mg/dL (ref <130)
TRIGLYCERIDES: 54 mg/dL (ref <150)
Total CHOL/HDL Ratio: 2.8 Ratio (ref <=5.0)
VLDL: 11 mg/dL (ref <30)

## 2015-08-02 MED ORDER — EZETIMIBE 10 MG PO TABS: 10.0000 mg | ORAL_TABLET | Freq: Every day | ORAL | Status: DC

## 2015-09-09 ENCOUNTER — Other Ambulatory Visit: Payer: Self-pay | Admitting: *Deleted

## 2015-09-09 MED ORDER — LISINOPRIL 2.5 MG PO TABS: 2.5000 mg | ORAL_TABLET | Freq: Every day | ORAL | Status: DC

## 2015-09-09 MED ORDER — METOPROLOL TARTRATE 25 MG PO TABS: 12.5000 mg | ORAL_TABLET | Freq: Two times a day (BID) | ORAL | Status: DC

## 2015-10-30 IMAGING — CR DG CHEST 1V PORT
1 series · 1 of 1 positions shown · non-contrast
Comparison: None.

CLINICAL DATA: Mid chest and epigastric pain onset 1 hr ago.

EXAM:
PORTABLE CHEST - 1 VIEW

[view not recorded]
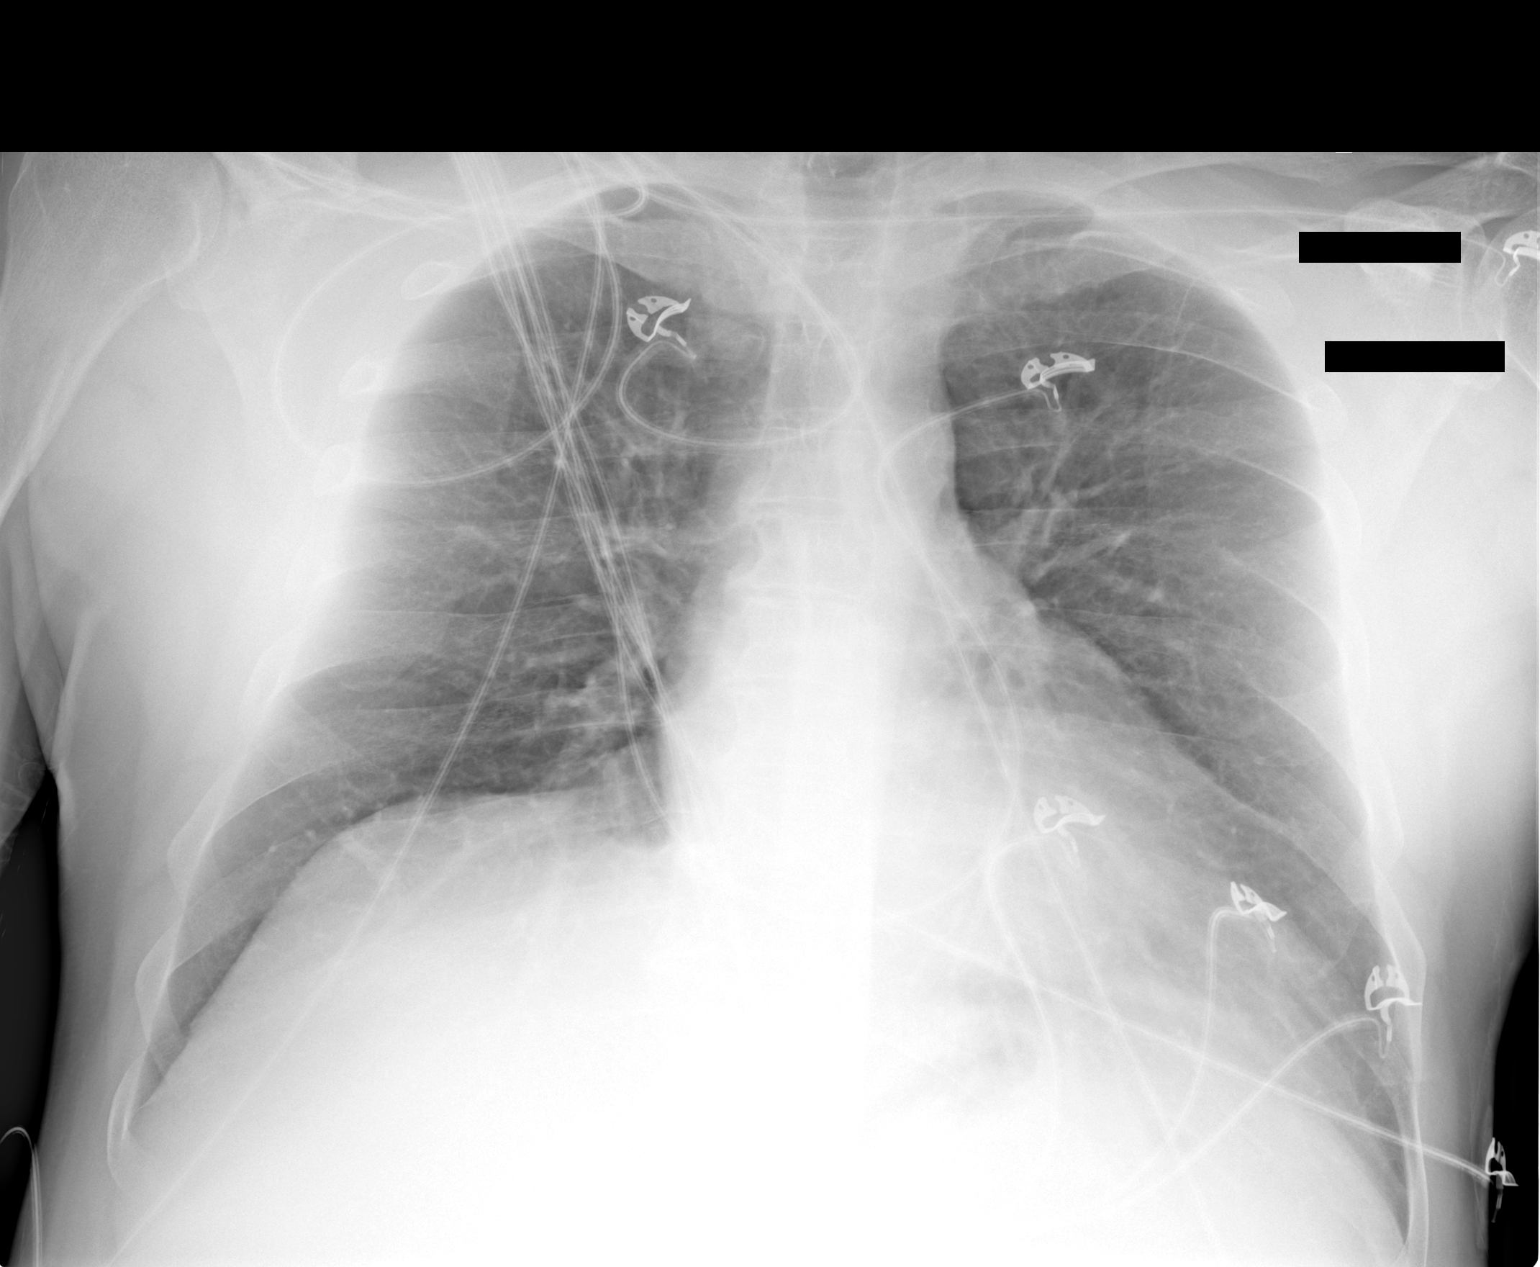

[1 of 1 positions shown; findings below may reference images not displayed]

FINDINGS: The heart size and mediastinal contours are within normal limits.
Both lungs are clear. The visualized skeletal structures are
unremarkable.
IMPRESSION: No active disease.

## 2015-10-30 IMAGING — CR DG CHEST 1V PORT
1 series · 1 of 1 positions shown · non-contrast
Comparison: 10/15/13

CLINICAL DATA: CABG.  Postop.

EXAM:
PORTABLE CHEST - 1 VIEW

[AP]
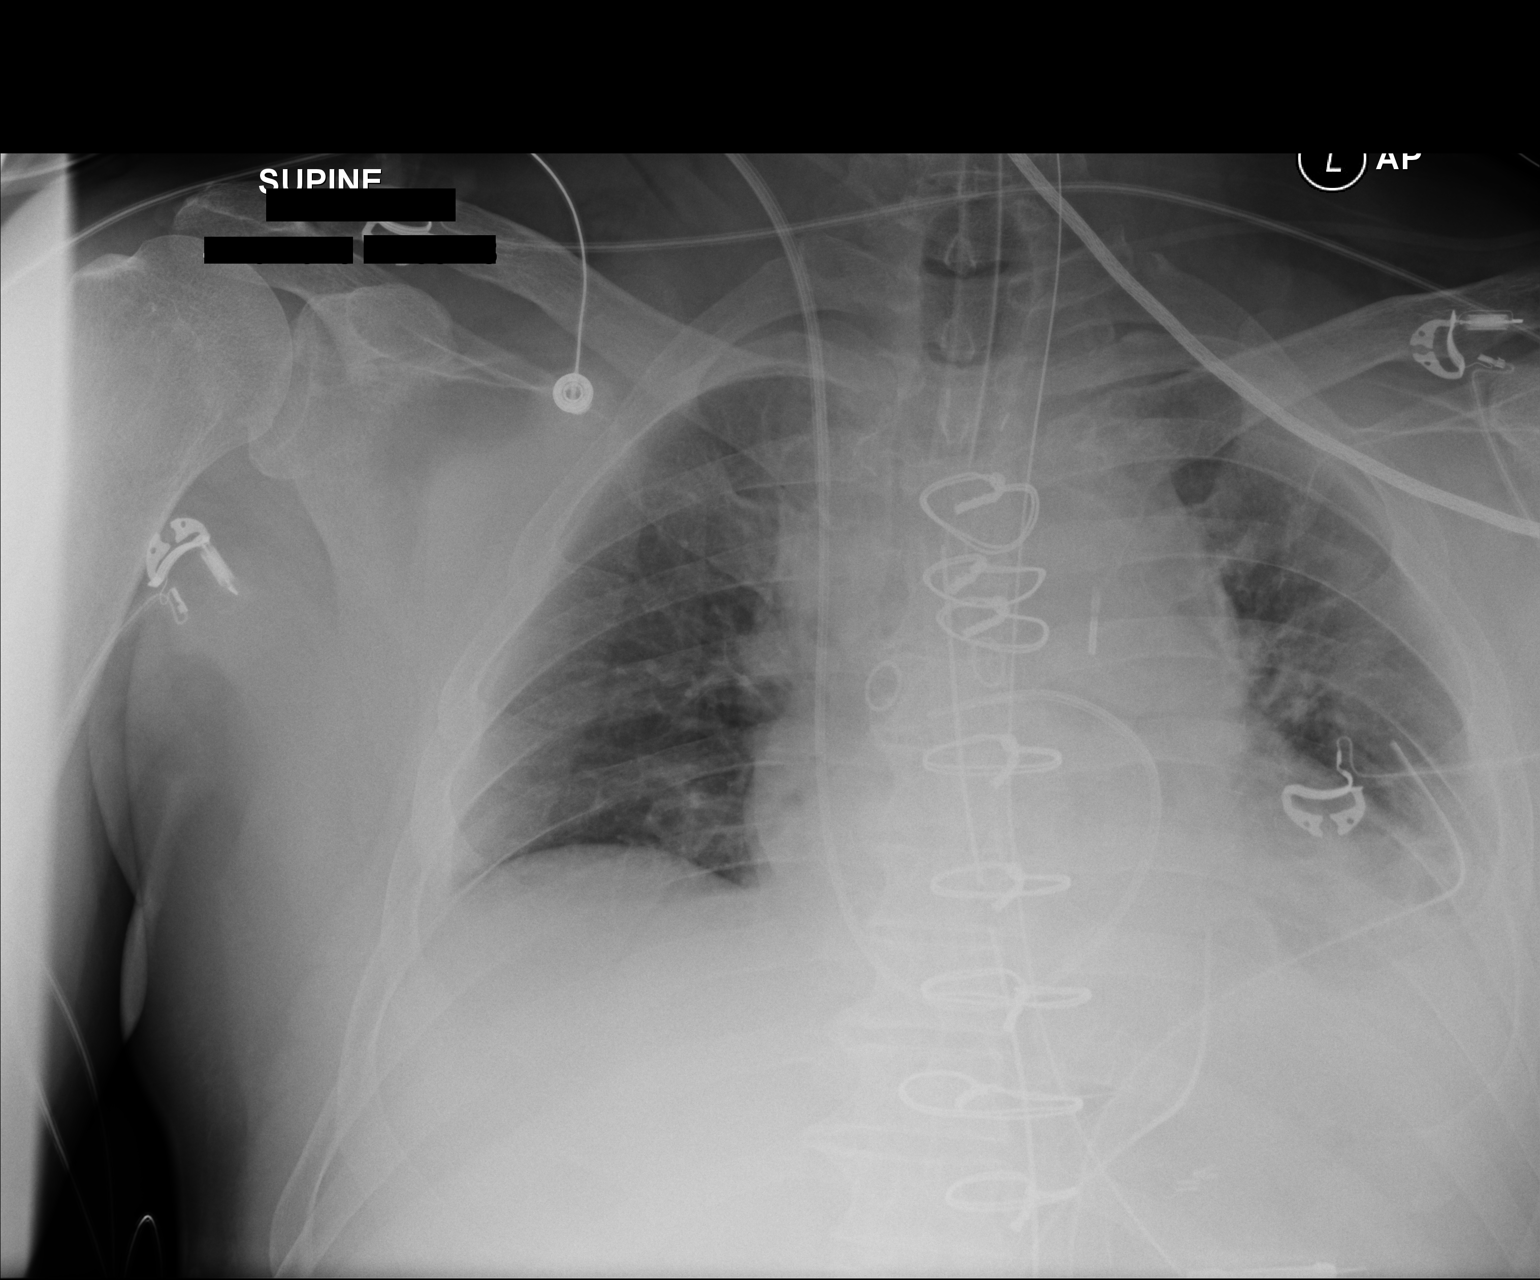

[1 of 1 positions shown; findings below may reference images not displayed]

FINDINGS: Endotracheal tube terminates 2.7 cm above carina. Nasogastric tube
extends beyond the inferior aspect of the film.

Interval median sternotomy. Right IJ Swan-Ganz catheter with tip at
proximal right pulmonary artery. An intra-aortic balloon pump
terminates at the proximal descending thoracic aorta. Left-sided
chest tubes and a mediastinal drain. No pneumothorax. Possible small
left pleural effusion. Adjacent left base airspace disease.
Cardiomegaly accentuated by AP portable technique. Superior
mediastinal soft tissue fullness which is likely postoperative. No
congestive failure.
IMPRESSION: Expected appearance after median sternotomy for CABG.

Left-sided chest tubes in place, without pneumothorax.

Probable small left pleural effusion with adjacent atelectasis.

## 2015-10-30 IMAGING — CR DG CHEST 1V PORT
1 series · 1 of 1 positions shown · non-contrast
Comparison: 10/15/13

CLINICAL DATA: Chest pain.  Status post cardiac catheterization.

EXAM:
PORTABLE CHEST - 1 VIEW

[AP]
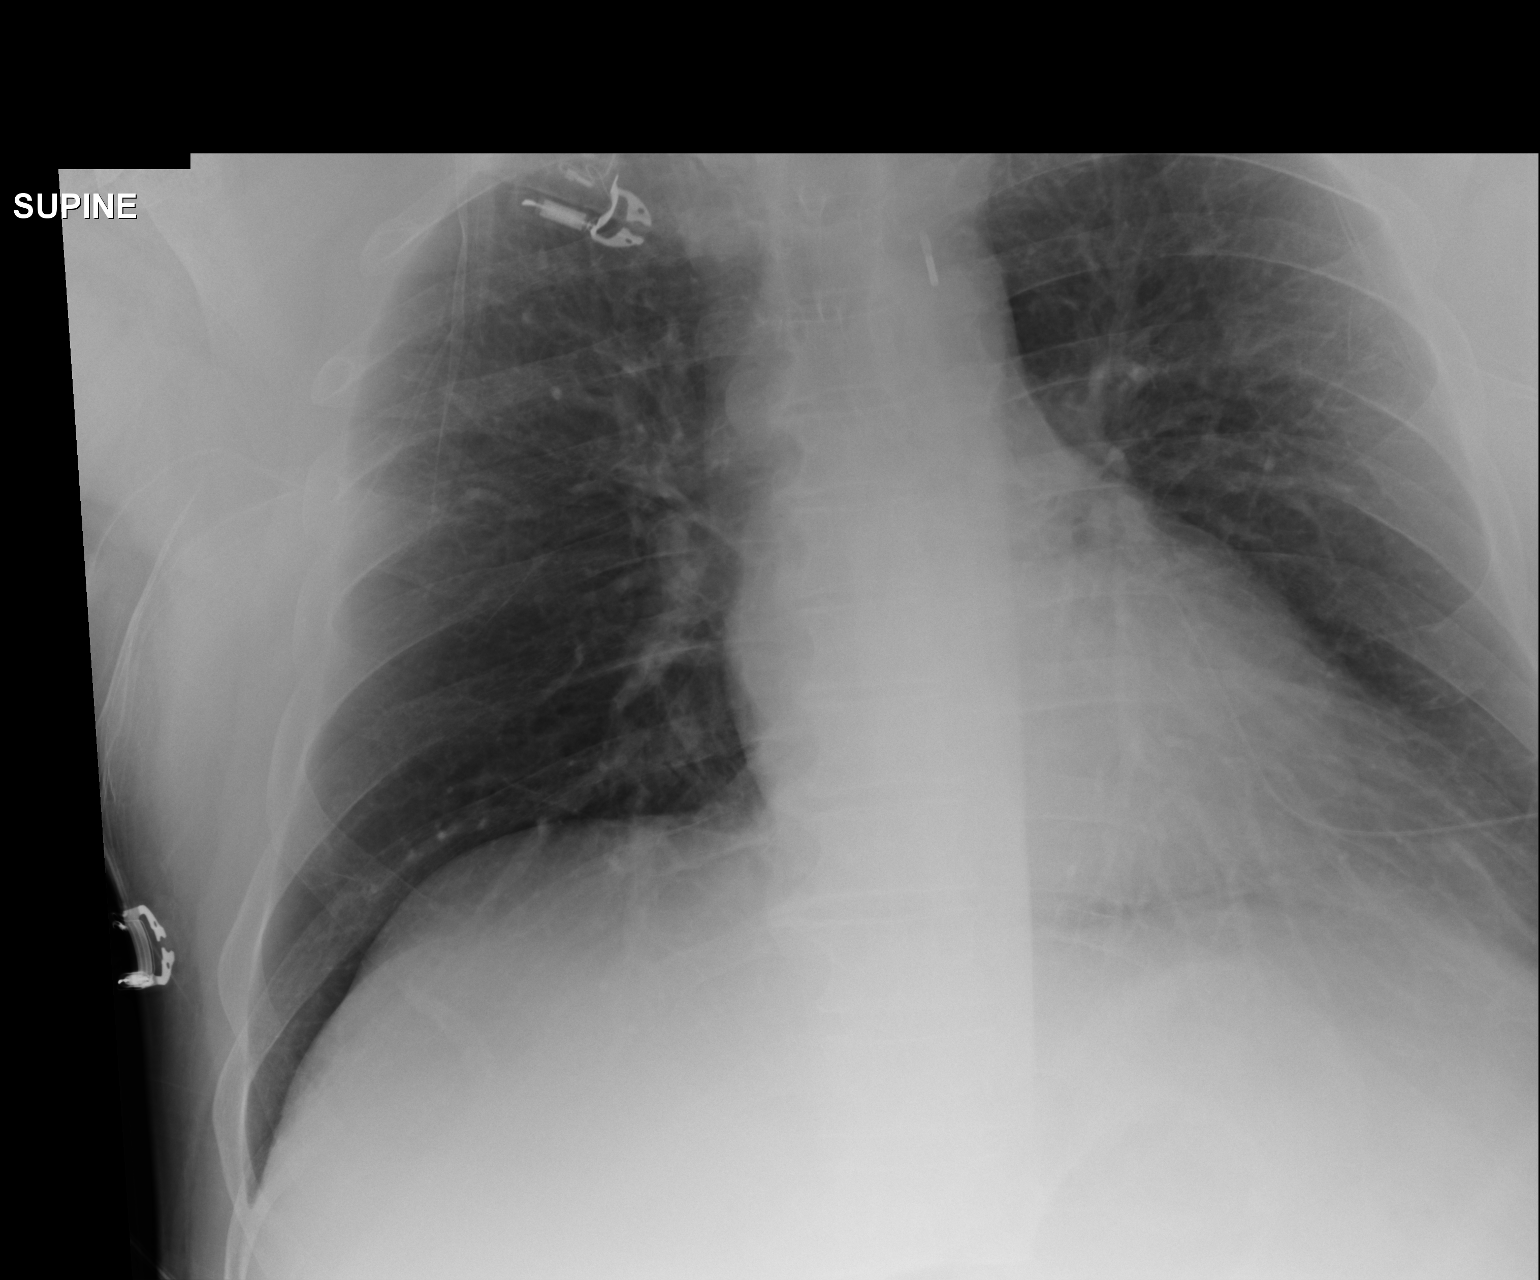

[1 of 1 positions shown; findings below may reference images not displayed]

FINDINGS: Apical lordotic positioning. Midline trachea. Borderline
cardiomegaly. The inferior lateral left hemi thorax is partially
excluded. Catheter projecting over the left side of the chest is
presumably external to the patient. There is clothing or support
apparatus artifact over the right apex. No pleural effusion or
pneumothorax. No congestive failure. Clear lungs.
IMPRESSION: No acute cardiopulmonary disease.

## 2015-10-31 IMAGING — CR DG CHEST 1V PORT
1 series · 1 of 1 positions shown · non-contrast
Comparison: DG CHEST 1V PORT dated 10/15/2013

CLINICAL DATA: CABG.

EXAM:
PORTABLE CHEST - 1 VIEW

[AP]
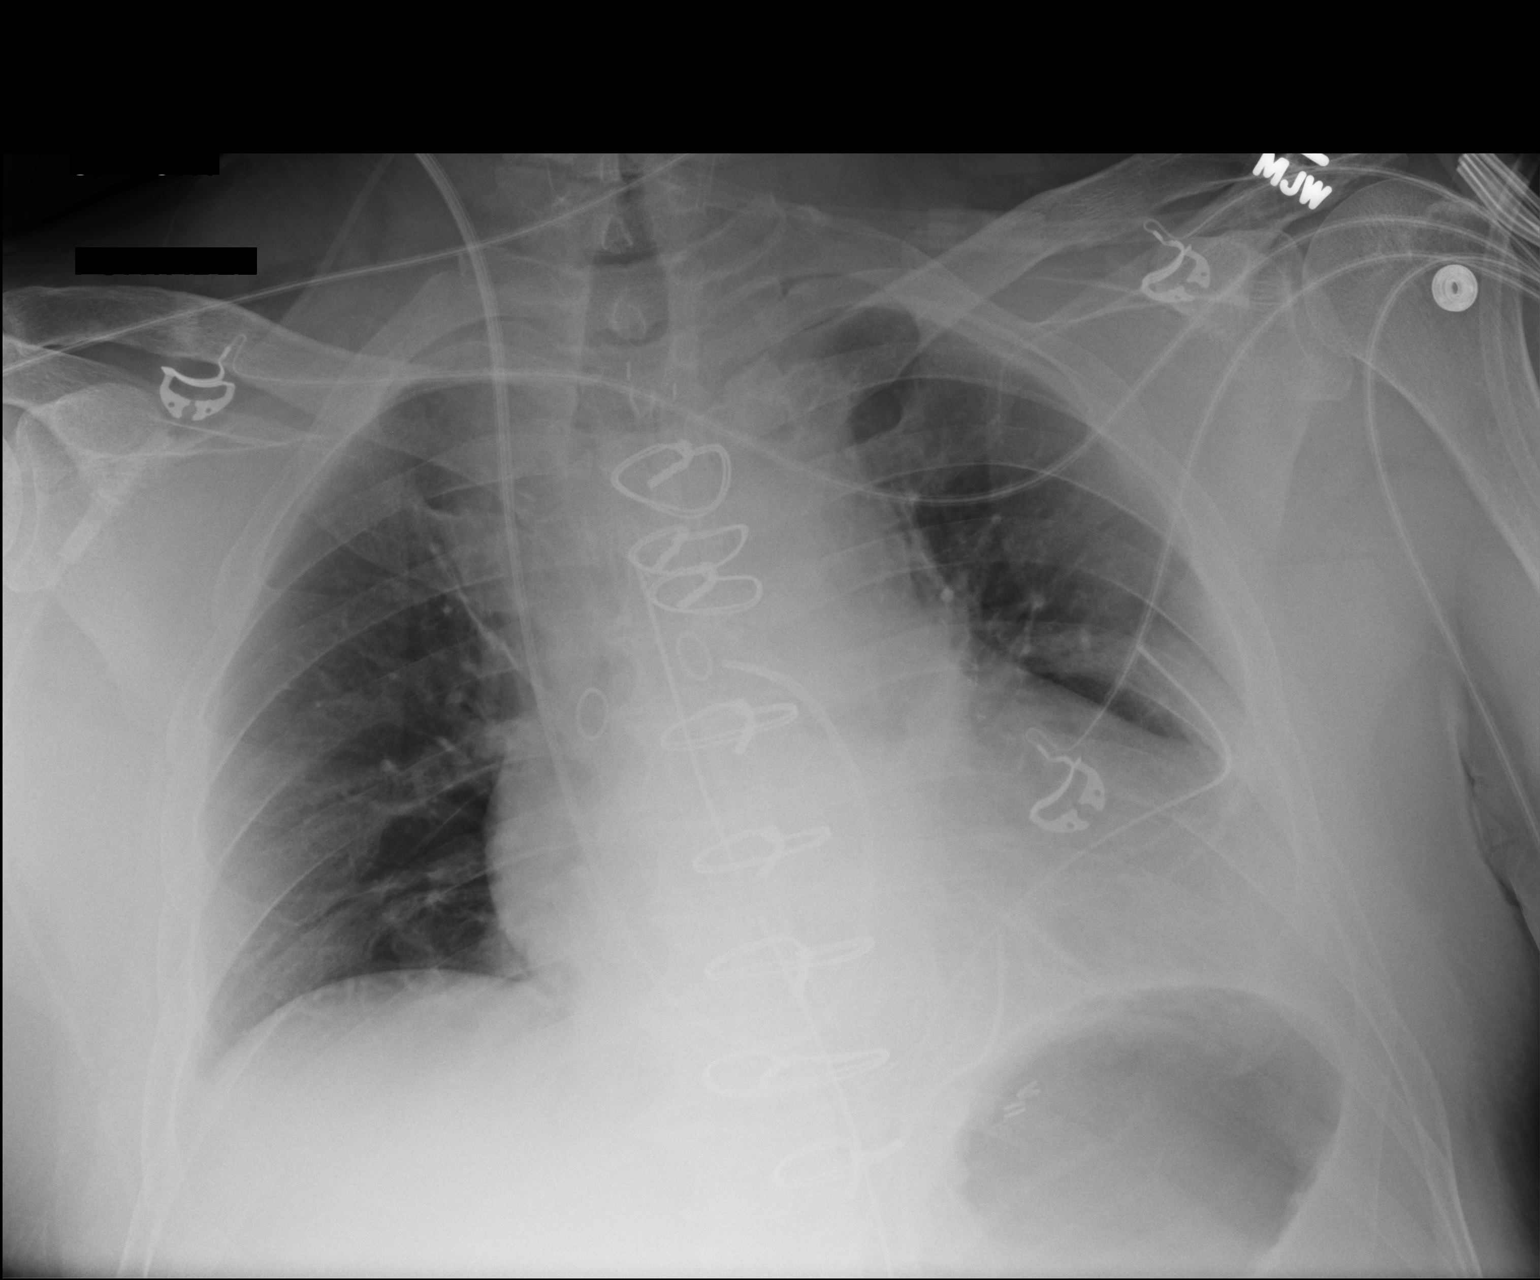

[1 of 1 positions shown; findings below may reference images not displayed]

FINDINGS: Interim extubation and removal of intra-aortic balloon pump.
Swan-Ganz catheter, mediastinal drainage catheter, left chest tubes
in stable position. Cardiomegaly. Prior CABG. Normal pulmonary
vascularity, no CHF. Mild left base atelectasis. No pneumothorax.
Surgical clips left upper quadrant.
IMPRESSION: 1. Interim extubation and removal of the intra-aortic balloon pump.
Line and tube positions otherwise stable.
2. Prior CABG.  Mild cardiomegaly.  No CHF.
3. Mild left base atelectasis. Small left pleural effusion cannot be
excluded.

## 2015-11-01 IMAGING — CR DG CHEST 1V PORT
1 series · 1 of 1 positions shown · non-contrast
Comparison: DG CHEST 1V PORT dated 10/16/2013; DG CHEST 1V PORT
dated 10/15/2013; DG CHEST 1V PORT dated 10/15/2013

CLINICAL DATA: Postoperative evaluation.  Cardiac surgery.

EXAM:
PORTABLE CHEST - 1 VIEW

[AP]
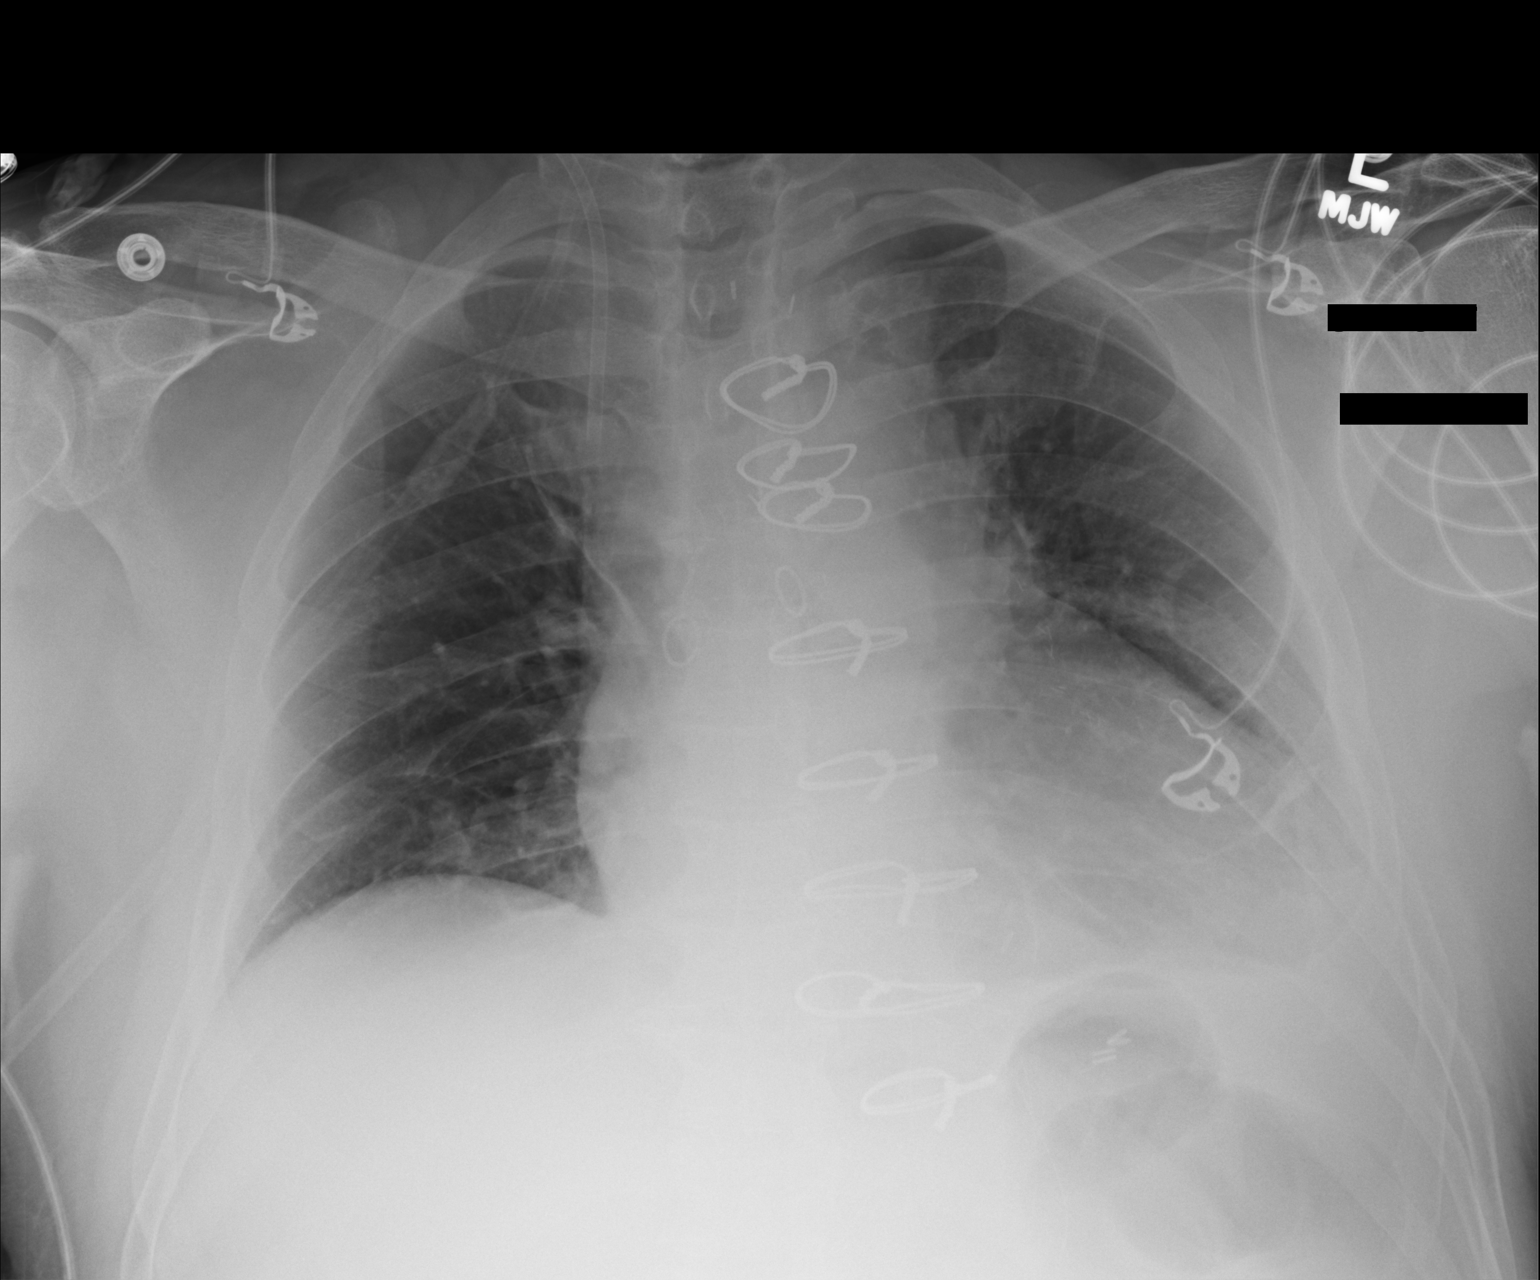

[1 of 1 positions shown; findings below may reference images not displayed]

FINDINGS: Right IJ sheath projects over the superior vena cava, interval
retraction of central venous catheter. Interval removal mediastinal
drains and left chest tube. Stable enlarged cardiac and mediastinal
contours status post median sternotomy. Persistent heterogeneous
opacities left lung base. Probable small left pleural effusion. No
definite pneumothorax.
IMPRESSION: 1. Small left pleural effusion and left basilar atelectasis.
2. Interval removal left chest tube without definite pneumothorax.

## 2016-06-03 NOTE — Progress Notes (Signed)
Cardiology Office Note   Date:  06/04/2016   ID:  Kenneth Humphrey, DOB 12-Apr-1961, MRN [id number]  PCP:  Sheran Fava, MD    No chief complaint on file. f/u CAD   Wt Readings from Last 3 Encounters:  06/04/16 203 lb 12.8 oz (92.4 kg)  06/05/15 191 lb 12.8 oz (87 kg)  10/15/14 190 lb (86.2 kg)       History of Present Illness: Kenneth Humphrey is a 55 y.o. male  with a history of STEMI 09/2013 w/ POBA LAD.  CABG w/ L-LAD, S-PDA, S-RI-OM, EF 30%, Crohn's dz, HL. EF improved to 55% by 10/2013.  Taking Praluent for hyperlipidemia.  Lipitor was decreased due to aches.   EF in 12/16 was 55-60%.  He feels fatigued.  On days he forgets his meds, he feels more energetic.  BPs typically run in the 102- 120.  He would like to decrease his BP meds.    He had muscle aches on the 80 of lipitor that have improved since decreasing to 40 mg daily of lipitor in 12/16.  LDL was 47 in 2/17, 2 months after making the change.  Since last year, he had a cholecystectomy.  No cardiac issues at that time.    He walks 8-10 miles /week.  He has gained weight.  He was eating more to try to increase energy level.  No sx like his prior MI.     Past Medical History:  Diagnosis Date  . Acute myocardial infarction of other anterior wall, initial episode of care 10/15/2013  . Coronary artery disease 10/15/2013  . Crohn disease (HCC)   . Hx of echocardiogram    Echo (10/2013):  Mild LVH, EF 55-60%, mild AS hypokinesis  . Mixed hyperlipidemia   . 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 CORONARY ARTERY BYPASS GRAFT N/A 10/15/2013   Procedure: CORONARY ARTERY BYPASS GRAFTING (CABG);  Surgeon: [name], MD;  Location: [facility] OR;  Service: Open Heart Surgery;  Laterality: N/A;  CABG x4  . HERNIA REPAIR    . LEFT HEART CATH N/A 10/15/2013   Procedure: LEFT HEART CATH;   Surgeon: Corky Crafts, MD;  Location: [facility] CATH LAB;  Service: Cardiovascular;  Laterality: N/A;  . TEE WITHOUT CARDIOVERSION N/A 10/15/2013   Procedure: TRANSESOPHAGEAL ECHOCARDIOGRAM (TEE);  Surgeon: [name], MD;  Location: [facility] OR;  Service: Open Heart Surgery;  Laterality: N/A;     Current Outpatient Prescriptions  Medication Sig Dispense Refill  . Alirocumab (PRALUENT) 75 MG/ML SOPN Inject 75 mg into the skin every 14 (fourteen) days. 2 pen 11  . aspirin 81 MG tablet Take 81 mg by mouth daily.    Marland Kitchen atorvastatin (LIPITOR) 40 MG tablet Take 1 tablet (40 mg total) by mouth daily. 90 tablet 3  . co-enzyme Q-10 50 MG capsule Take 100 mg by mouth daily.     Marland Kitchen ezetimibe (ZETIA) 10 MG tablet Take 1 tablet (10 mg total) by mouth daily. 90 tablet 3  . lisinopril (PRINIVIL,ZESTRIL) 2.5 MG tablet Take 1 tablet (2.5 mg total) by mouth daily. 90 tablet 3  . metoprolol tartrate (LOPRESSOR) 25 MG tablet Take 0.5 tablets (12.5 mg total) by mouth 2 (two) times daily. 90 tablet 3  . Multiple Vitamin (MULTI VITAMIN DAILY PO) Take 1 tablet by  mouth daily.    . nitroGLYCERIN (NITROSTAT) 0.4 MG SL tablet Place 1 tablet (0.4 mg total) under the tongue every 5 (five) minutes as needed for chest pain. 25 tablet 6   No current facility-administered medications for this visit.     Allergies:   Fish allergy    Social History:  The patient  reports that he has never smoked. He has never used smokeless tobacco. He reports that he does not drink alcohol or use drugs.   Family History:  The patient's family history includes Cancer in his mother; Coronary artery disease (age of onset: 32) in his father.    ROS:  Please see the history of present illness.   Otherwise, review of systems are positive for fatigue.   All other systems are reviewed and negative.    PHYSICAL EXAM: VS:  BP 102/60   Pulse 62   Ht 6\' 4"  (1.93 m)   Wt 203 lb 12.8 oz (92.4 kg)   SpO2 98%   BMI 24.81 kg/m  , BMI Body mass  index is 24.81 kg/m. GEN: Well nourished, well developed, in no acute distress  HEENT: normal  Neck: no JVD, carotid bruits, or masses Cardiac: RRR; no murmurs, rubs, or gallops,no edema  Respiratory:  clear to auscultation bilaterally, normal work of breathing GI: soft, nontender, nondistended, + BS MS: no deformity or atrophy  Skin: warm and dry, no rash Neuro:  Strength and sensation are intact Psych: euthymic mood, full affect   EKG:   The ekg ordered today demonstrates sinus bradycardia, no ST segment changes   Recent Labs: 08/02/2015: ALT 26   Lipid Panel    Component Value Date/Time   CHOL 91 (L) 08/02/2015 0812   TRIG 54 08/02/2015 0812   HDL 33 (L) 08/02/2015 0812   CHOLHDL 2.8 08/02/2015 0812   VLDL 11 08/02/2015 0812   LDLCALC 47 08/02/2015 0973     Other studies Reviewed: Additional studies/ records that were reviewed today with results demonstrating: cath records.   ASSESSMENT AND PLAN:  1. CAD: No angina.  He walks frequently.  Continue aggressive secondary prevention. 2. 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   3. Hyperlipidemia: LDL has increased.  Watch diet more carefully.  Recheck lipids in Feb.  COntinue lipitor 40 and praluent.  I doubt that dropping the lipitor dose would have such an effect on lipids while taking praluent.  LDL was 47 after 2 months of praluent and lipitor 40.  4. Fatigue: Stop metoprolol and see if there is an improvement in energy.  Fatigue also may be related to lisinopril.  If fatigue persists, would consider stopping lisinopril.  He will let us know how he feels in a few weeks.    Current medicines are reviewed at length with the patient today.  The patient concerns regarding his medicines were addressed.  The following changes have been made:  Stop lisinopril  Labs/ tests ordered today include: lipids in 2/18 No orders of the defined types were placed in this encounter.   Recommend 150 minutes/week of aerobic exercise Low fat, low carb,  high fiber diet recommended  Disposition:   FU in 1 year   Signed, Lance Muss, MD  06/04/2016 8:22 AM    [facility] [address] [place], [place], [place]  [address] Phone: [phone]; Fax: [phone]

## 2016-06-04 ENCOUNTER — Ambulatory Visit (INDEPENDENT_AMBULATORY_CARE_PROVIDER_SITE_OTHER): Payer: 59 | Admitting: Interventional Cardiology

## 2016-06-04 ENCOUNTER — Encounter: Payer: Self-pay | Admitting: Interventional Cardiology

## 2016-06-04 VITALS — BP 102/60 | HR 62 | Ht 76.0 in | Wt 203.8 lb

## 2016-06-04 DIAGNOSIS — I2510 Atherosclerotic heart disease of native coronary artery without angina pectoris: Secondary | ICD-10-CM | POA: Diagnosis not present

## 2016-06-04 DIAGNOSIS — R5383 Other fatigue: Secondary | ICD-10-CM | POA: Diagnosis not present

## 2016-06-04 DIAGNOSIS — Z951 Presence of aortocoronary bypass graft: Secondary | ICD-10-CM

## 2016-06-04 DIAGNOSIS — E782 Mixed hyperlipidemia: Secondary | ICD-10-CM | POA: Diagnosis not present

## 2016-06-04 MED ORDER — LISINOPRIL 2.5 MG PO TABS: 2.5000 mg | ORAL_TABLET | Freq: Every day | ORAL | 3 refills | Status: DC

## 2016-06-04 NOTE — Patient Instructions (Addendum)
**Note De-Identified  Obfuscation** Medication Instructions:  Stop taking Metoprolol. All other medications remain the same.  Please call us at [phone] if your weakness/fatigue gets worse.  Labwork: Lipids and LFTs In February 2018. Please do not eat or drink after midnight the night prior to lab draw.  Testing/Procedures: None  Follow-Up: ,Your physician wants you to follow-up in: 1 year. You will receive a reminder letter in the mail two months in advance. If you don't receive a letter, please call our office to schedule the follow-up appointment.     If you need a refill on your cardiac medications before your next appointment, please call your pharmacy.

## 2016-06-30 ENCOUNTER — Other Ambulatory Visit: Payer: Self-pay | Admitting: Pharmacist

## 2016-06-30 MED ORDER — ALIROCUMAB 75 MG/ML ~~LOC~~ SOPN: 75.0000 mg | PEN_INJECTOR | SUBCUTANEOUS | 11 refills | Status: DC

## 2016-07-09 ENCOUNTER — Other Ambulatory Visit: Payer: Self-pay | Admitting: Interventional Cardiology

## 2016-08-07 ENCOUNTER — Other Ambulatory Visit: Payer: 59 | Admitting: *Deleted

## 2016-08-07 DIAGNOSIS — E782 Mixed hyperlipidemia: Secondary | ICD-10-CM

## 2016-08-07 LAB — HEPATIC FUNCTION PANEL
ALBUMIN: 4 g/dL (ref 3.5–5.5)
ALT: 36 IU/L (ref 0–44)
AST: 26 IU/L (ref 0–40)
Alkaline Phosphatase: 98 IU/L (ref 39–117)
BILIRUBIN TOTAL: 0.4 mg/dL (ref 0.0–1.2)
BILIRUBIN, DIRECT: 0.11 mg/dL (ref 0.00–0.40)
Total Protein: 6.2 g/dL (ref 6.0–8.5)

## 2016-08-07 LAB — LIPID PANEL
CHOLESTEROL TOTAL: 155 mg/dL (ref 100–199)
Chol/HDL Ratio: 5.3 ratio units — ABNORMAL HIGH (ref 0.0–5.0)
HDL: 29 mg/dL — ABNORMAL LOW (ref >39)
LDL Calculated: 106 mg/dL — ABNORMAL HIGH (ref 0–99)
Triglycerides: 98 mg/dL (ref 0–149)
VLDL Cholesterol Cal: 20 mg/dL (ref 5–40)

## 2016-08-14 ENCOUNTER — Other Ambulatory Visit: Payer: Self-pay

## 2016-08-14 MED ORDER — ATORVASTATIN CALCIUM 80 MG PO TABS: 80.0000 mg | ORAL_TABLET | Freq: Every day | ORAL | 3 refills | Status: DC

## 2016-08-16 ENCOUNTER — Other Ambulatory Visit: Payer: Self-pay | Admitting: Interventional Cardiology

## 2016-08-17 NOTE — Telephone Encounter (Signed)
AVS Reports   Date/Time Report Action User  06/04/2016 8:41 AM After Visit Summary Printed Lorelle Formosa Via, LPN  01/02/8087 1:10 AM After Visit Summary Printed Lorelle Formosa Via, LPN  Patient Instructions   Medication Instructions:  Stop taking Metoprolol.

## 2016-08-19 ENCOUNTER — Other Ambulatory Visit: Payer: Self-pay | Admitting: Pharmacist

## 2016-08-19 DIAGNOSIS — E785 Hyperlipidemia, unspecified: Secondary | ICD-10-CM

## 2016-09-18 ENCOUNTER — Other Ambulatory Visit: Payer: 59 | Admitting: *Deleted

## 2016-09-18 DIAGNOSIS — E785 Hyperlipidemia, unspecified: Secondary | ICD-10-CM

## 2016-09-18 LAB — LIPID PANEL
CHOLESTEROL TOTAL: 118 mg/dL (ref 100–199)
Chol/HDL Ratio: 4.2 ratio units (ref 0.0–5.0)
HDL: 28 mg/dL — ABNORMAL LOW (ref >39)
LDL CALC: 73 mg/dL (ref 0–99)
TRIGLYCERIDES: 86 mg/dL (ref 0–149)
VLDL Cholesterol Cal: 17 mg/dL (ref 5–40)

## 2016-09-23 ENCOUNTER — Other Ambulatory Visit: Payer: Self-pay | Admitting: Pharmacist

## 2016-09-23 MED ORDER — ALIROCUMAB 75 MG/ML ~~LOC~~ SOPN: 75.0000 mg | PEN_INJECTOR | SUBCUTANEOUS | 11 refills | Status: DC

## 2017-06-10 ENCOUNTER — Other Ambulatory Visit: Payer: Self-pay | Admitting: Interventional Cardiology

## 2017-06-25 NOTE — Progress Notes (Signed)
Cardiology Office Note   Date:  06/28/2017   ID:  Kenneth Humphrey, DOB January 07, 1961, MRN [id number]  PCP:  Kenneth Fava, MD    No chief complaint on file. CAD   Wt Readings from Last 3 Encounters:  06/28/17 209 lb (94.8 kg)  06/04/16 203 lb 12.8 oz (92.4 kg)  06/05/15 191 lb 12.8 oz (87 kg)       History of Present Illness: Kenneth Humphrey is a 56 y.o. male   with a history of STEMI 09/2013 w/ POBA LAD. CABG w/ L-LAD, S-PDA, S-RI-OM, EF 30%, Crohn's dz, HL. EF improved to 55% by 10/2013.  EF in 12/16 was 55-60%.   On days he forgets his meds, he feels more energetic.  BPs typically run in the 102- 120.  He would like to decrease his BP meds.    He had muscle aches on the 80 of lipitor that have improved since decreasing to 40 mg daily of lipitor in 12/16.  LDL was 47 in 2/17, 2 months after making the change. Ultimately changed top Praluent with atorvastatin.  Sx controlled by skipping atorvastatin occasionally and taking CoQ10.   Denies : Chest pain. Dizziness. Leg edema. Nitroglycerin use. Orthopnea. Palpitations. Paroxysmal nocturnal dyspnea. Shortness of breath. Syncope. Kenneth Humphrey 3-4x/week.  He walks for 35-40 minutes.  Fatigue improved after stopping metoprolol.  BP has been in the 120 systolic range.    Currently working in [place] and travelling back and forth from [place].  Past Medical History:  Diagnosis Date  . Acute myocardial infarction of other anterior wall, initial episode of care 10/15/2013  . Coronary artery disease 10/15/2013  . Crohn disease (HCC)   . Hx of echocardiogram    Echo (10/2013):  Mild LVH, EF 55-60%, mild AS hypokinesis  . Mixed hyperlipidemia   . 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 CORONARY ARTERY BYPASS GRAFT N/A 10/15/2013   Procedure: CORONARY ARTERY BYPASS GRAFTING (CABG);  Surgeon: [name], MD;  Location: [facility] OR;  Service: Open Heart Surgery;  Laterality: N/A;  CABG x4  . HERNIA REPAIR    . LEFT HEART CATH N/A 10/15/2013   Procedure: LEFT HEART CATH;  Surgeon: Corky Crafts, MD;  Location: [facility] CATH LAB;  Service: Cardiovascular;  Laterality: N/A;  . TEE WITHOUT CARDIOVERSION N/A 10/15/2013   Procedure: TRANSESOPHAGEAL ECHOCARDIOGRAM (TEE);  Surgeon: [name], MD;  Location: [facility] OR;  Service: Open Heart Surgery;  Laterality: N/A;     Current Outpatient Medications  Medication Sig Dispense Refill  . Alirocumab (PRALUENT) 75 MG/ML SOPN Inject 75 mg into the skin every 14 (fourteen) days. 2 pen 11  . aspirin 81 MG tablet Take 81 mg by mouth daily.    Marland Kitchen co-enzyme Q-10 50 MG capsule Take 100 mg by mouth daily.     Marland Kitchen ezetimibe (ZETIA) 10 MG tablet TAKE 1 TABLET BY MOUTH  DAILY 90 tablet 0  . lisinopril (PRINIVIL,ZESTRIL) 2.5 MG tablet Take 1 tablet (2.5 mg total) by mouth daily. 90 tablet 3  . Multiple Vitamin (MULTI VITAMIN DAILY PO) Take 1 tablet by mouth daily.    . nitroGLYCERIN (NITROSTAT) 0.4 MG SL tablet Place 1 tablet (0.4 mg total) under the tongue every 5 (five) minutes as needed for chest pain. 25 tablet 6  .  atorvastatin (LIPITOR) 80 MG tablet Take 1 tablet (80 mg total) by mouth daily. 90 tablet 3   No current facility-administered medications for this visit.     Allergies:   Fish allergy and Fish-derived products    Social History:  The patient  reports that  has never smoked. he has never used smokeless tobacco. He reports that he does not drink alcohol or use drugs.   Family History:  The patient's family history includes Cancer in his mother; Coronary artery disease (age of onset: 16) in his father.    ROS:  Please see the history of present illness.   Otherwise, review of systems are positive for weight gain.   All other systems are reviewed and negative.    PHYSICAL EXAM: VS:  BP 122/78   Pulse 76   Ht 6\' 4"  (1.93 m)   Wt 209 lb (94.8  kg)   SpO2 97%   BMI 25.44 kg/m  , BMI Body mass index is 25.44 kg/m. GEN: Well nourished, well developed, in no acute distress  HEENT: normal  Neck: no JVD, carotid bruits, or masses Cardiac: RRR; no murmurs, rubs, or gallops,no edema  Respiratory:  clear to auscultation bilaterally, normal work of breathing GI: soft, nontender, nondistended, + BS MS: no deformity or atrophy  Skin: warm and dry, no rash Neuro:  Strength and sensation are intact Psych: euthymic mood, full affect   EKG:   The ekg ordered today demonstrates NSR, nonspecific ST changes   Recent Labs: 08/07/2016: ALT 36   Lipid Panel    Component Value Date/Time   CHOL 118 09/18/2016 0730   TRIG 86 09/18/2016 0730   HDL 28 (L) 09/18/2016 0730   CHOLHDL 4.2 09/18/2016 0730   CHOLHDL 2.8 08/02/2015 0812   VLDL 11 08/02/2015 0812   LDLCALC 73 09/18/2016 0730     Other studies Reviewed: Additional studies/ records that were reviewed today with results demonstrating: LDL controlled in 3/18.   ASSESSMENT AND PLAN:  1. CAD: No angina.   COntinue aggressive secondary prevention.  Continue regular exercise to help manage weight gain as well.  2. 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  EF has recovered by 2016 echo 3. Hyperlipidemia: COntinue lipid lowering therapies as outlined above.  Will recheck lipids in the spring.  He will let us know when he is back in town. 4. Fatigue: Improved after stopping metoprolol.  5. Will need meds prescribed to new pharmacy as insurance is changing as of Jan 1.     Current medicines are reviewed at length with the patient today.  The patient concerns regarding his medicines were addressed.  The following changes have been made:  No change  Labs/ tests ordered today include:   Orders Placed This Encounter  Procedures  . Comprehensive metabolic panel  . Lipid panel  . EKG 12-Lead    Recommend 150 minutes/week of aerobic exercise Low fat, low carb, high fiber diet recommended  Disposition:    FU in 1 year   Signed, Lance Muss, MD  06/28/2017 3:47 PM    [facility] [address] [place], [place], [place]  [address] Phone: [phone]; Fax: [phone]

## 2017-06-28 ENCOUNTER — Ambulatory Visit: Payer: 59 | Admitting: Interventional Cardiology

## 2017-06-28 ENCOUNTER — Encounter: Payer: Self-pay | Admitting: Interventional Cardiology

## 2017-06-28 VITALS — BP 122/78 | HR 76 | Ht 76.0 in | Wt 209.0 lb

## 2017-06-28 DIAGNOSIS — Z951 Presence of aortocoronary bypass graft: Secondary | ICD-10-CM

## 2017-06-28 DIAGNOSIS — I2510 Atherosclerotic heart disease of native coronary artery without angina pectoris: Secondary | ICD-10-CM | POA: Diagnosis not present

## 2017-06-28 DIAGNOSIS — E782 Mixed hyperlipidemia: Secondary | ICD-10-CM | POA: Diagnosis not present

## 2017-06-28 DIAGNOSIS — R5383 Other fatigue: Secondary | ICD-10-CM

## 2017-06-28 NOTE — Patient Instructions (Addendum)
Medication Instructions:  Your physician recommends that you continue on your current medications as directed. Please refer to the Current Medication list given to you today.   Labwork: Your physician recommends that you return for FASTING lab work in: April for CMET and LIPIDS. Please call us to schedule this appointment.   Testing/Procedures: None ordered  Follow-Up: Your physician wants you to follow-up in: 1 year with Dr. Eldridge Dace. You will receive a reminder letter in the mail two months in advance. If you don't receive a letter, please call our office to schedule the follow-up appointment.   Any Other Special Instructions Will Be Listed Below (If Applicable).     If you need a refill on your cardiac medications before your next appointment, please call your pharmacy.

## 2017-06-30 ENCOUNTER — Other Ambulatory Visit: Payer: Self-pay

## 2017-06-30 ENCOUNTER — Telehealth: Payer: Self-pay | Admitting: Pharmacist

## 2017-06-30 MED ORDER — LISINOPRIL 2.5 MG PO TABS: 2.5000 mg | ORAL_TABLET | Freq: Every day | ORAL | 3 refills | Status: DC

## 2017-06-30 MED ORDER — ATORVASTATIN CALCIUM 80 MG PO TABS: 80.0000 mg | ORAL_TABLET | Freq: Every day | ORAL | 3 refills | Status: DC

## 2017-06-30 MED ORDER — NITROGLYCERIN 0.4 MG SL SUBL: 0.4000 mg | SUBLINGUAL_TABLET | SUBLINGUAL | 6 refills | Status: DC | PRN

## 2017-06-30 MED ORDER — ALIROCUMAB 75 MG/ML ~~LOC~~ SOPN: 75.0000 mg | PEN_INJECTOR | SUBCUTANEOUS | 11 refills | Status: DC

## 2017-06-30 MED ORDER — EZETIMIBE 10 MG PO TABS: 10.0000 mg | ORAL_TABLET | Freq: Every day | ORAL | 3 refills | Status: DC

## 2017-06-30 NOTE — Addendum Note (Signed)
Addended by: Sallee Hogrefe E on: 06/30/2017 02:27 PM   Modules accepted: Orders

## 2017-06-30 NOTE — Telephone Encounter (Signed)
Pt has new insurance for 2019 - new prior authorization submitted for Praluent therapy. Will send in new rx once approved.

## 2017-06-30 NOTE — Telephone Encounter (Addendum)
PA approved immediately through 06/29/20. Refill sent to [facility] specialty pharmacy. LMOM for pt.

## 2017-08-04 ENCOUNTER — Other Ambulatory Visit: Payer: Self-pay | Admitting: *Deleted

## 2017-08-04 MED ORDER — ALIROCUMAB 75 MG/ML ~~LOC~~ SOPN: 75.0000 mg | PEN_INJECTOR | SUBCUTANEOUS | 11 refills | Status: DC

## 2017-08-04 NOTE — Telephone Encounter (Signed)
Patient called and stated that his insurance no longer allows him to refill the praluent through [facility]. He [facility]. Thanks, MI

## 2017-10-08 ENCOUNTER — Other Ambulatory Visit: Payer: 59

## 2017-10-08 DIAGNOSIS — I2510 Atherosclerotic heart disease of native coronary artery without angina pectoris: Secondary | ICD-10-CM

## 2017-10-08 DIAGNOSIS — E782 Mixed hyperlipidemia: Secondary | ICD-10-CM

## 2017-10-08 LAB — COMPREHENSIVE METABOLIC PANEL
A/G RATIO: 1.8 (ref 1.2–2.2)
ALT: 48 IU/L — AB (ref 0–44)
AST: 29 IU/L (ref 0–40)
Albumin: 4.1 g/dL (ref 3.5–5.5)
Alkaline Phosphatase: 105 IU/L (ref 39–117)
BUN/Creatinine Ratio: 17 (ref 9–20)
BUN: 16 mg/dL (ref 6–24)
Bilirubin Total: 0.3 mg/dL (ref 0.0–1.2)
CALCIUM: 9.4 mg/dL (ref 8.7–10.2)
CO2: 24 mmol/L (ref 20–29)
Chloride: 103 mmol/L (ref 96–106)
Creatinine, Ser: 0.96 mg/dL (ref 0.76–1.27)
GFR, EST AFRICAN AMERICAN: 100 mL/min/{1.73_m2} (ref >59)
GFR, EST NON AFRICAN AMERICAN: 87 mL/min/{1.73_m2} (ref >59)
GLUCOSE: 104 mg/dL — AB (ref 65–99)
Globulin, Total: 2.3 g/dL (ref 1.5–4.5)
Potassium: 5 mmol/L (ref 3.5–5.2)
Sodium: 140 mmol/L (ref 134–144)
TOTAL PROTEIN: 6.4 g/dL (ref 6.0–8.5)

## 2017-10-08 LAB — LIPID PANEL
Chol/HDL Ratio: 4.4 ratio (ref 0.0–5.0)
Cholesterol, Total: 137 mg/dL (ref 100–199)
HDL: 31 mg/dL — ABNORMAL LOW
LDL Calculated: 85 mg/dL (ref 0–99)
Triglycerides: 105 mg/dL (ref 0–149)
VLDL Cholesterol Cal: 21 mg/dL (ref 5–40)

## 2017-12-01 ENCOUNTER — Telehealth: Payer: Self-pay | Admitting: Interventional Cardiology

## 2017-12-01 MED ORDER — ALIROCUMAB 75 MG/ML ~~LOC~~ SOPN: 75.0000 mg | PEN_INJECTOR | SUBCUTANEOUS | 11 refills | Status: DC

## 2017-12-01 NOTE — Telephone Encounter (Signed)
Checking on prior auth for Purulent 75mg . Please advise

## 2017-12-01 NOTE — Telephone Encounter (Signed)
Per our records PA approved through 06/29/20. Spoke with representative and will fill through [facility] per insurance.

## 2017-12-06 ENCOUNTER — Telehealth: Payer: Self-pay | Admitting: Interventional Cardiology

## 2017-12-06 MED ORDER — ALIROCUMAB 75 MG/ML ~~LOC~~ SOPN: 75.0000 mg | PEN_INJECTOR | SUBCUTANEOUS | 11 refills | Status: DC

## 2017-12-06 NOTE — Telephone Encounter (Signed)
Spoke with pt - he had been filling rx at [facility] who is no longer filling PCSK9i scripts. Rx sent to local [facility] pharmacy per patient preference. He states he already has a copay card activated with them and they have Praluent in stock. Advised him to call clinic with any concerns.

## 2017-12-06 NOTE — Telephone Encounter (Signed)
LMOM for patient. PA is already approved through January 2022, he should not be having any issues with refills.

## 2017-12-06 NOTE — Addendum Note (Signed)
Addended by: Daleisa Halperin E on: 12/06/2017 11:15 AM   Modules accepted: Orders

## 2017-12-06 NOTE — Telephone Encounter (Signed)
New Message   Pt c/o medication issue:  1. Name of Medication: Alirocumab (PRALUENT) 75 MG/ML SOPN  2. How are you currently taking this medication (dosage and times per day)? Inject 75 mg into the skin every 14 (fourteen) days  3. Are you having a reaction (difficulty breathing--STAT)? no  4. What is your medication issue? Pt states that his insurance is requesting a prior authorization for the medication, Please call [phone]

## 2017-12-24 ENCOUNTER — Telehealth: Payer: Self-pay | Admitting: Interventional Cardiology

## 2017-12-24 NOTE — Telephone Encounter (Signed)
DISCUSSED WITH [facility] . PT CAN PUT NEEDLES IN SHARP CONTAINER THAT IS PROVIDED OR MAY USE EMPTY LAUNDRY  DETERGENT BOTTLE(THICKER PLASTIC CONTAINERS) AND THROW AWAY IN REGULAR TRASH . PT'S WIFE  AWARE .Kenneth Humphrey

## 2017-12-24 NOTE — Telephone Encounter (Signed)
New message  Pt c/o medication issue:  1. Name of Medication: Alirocumab (PRALUENT) 75 MG/ML SOPN  2. How are you currently taking this medication (dosage and times per day)? Inject 75 mg into the skin every 14 (fourteen) days.  3. Are you having a reaction (difficulty breathing--STAT)? no  4. What is your medication issue? Pt's wife calling, wanting to know what to do with the needles and containers. Please call

## 2018-07-04 ENCOUNTER — Other Ambulatory Visit: Payer: Self-pay | Admitting: Pharmacist

## 2018-07-04 MED ORDER — ALIROCUMAB 75 MG/ML ~~LOC~~ SOAJ: 1.0000 "pen " | SUBCUTANEOUS | 11 refills | Status: DC

## 2018-07-08 ENCOUNTER — Other Ambulatory Visit: Payer: Self-pay | Admitting: Interventional Cardiology

## 2018-07-08 MED ORDER — NITROGLYCERIN 0.4 MG SL SUBL: 0.4000 mg | SUBLINGUAL_TABLET | SUBLINGUAL | 3 refills | Status: DC | PRN

## 2018-07-08 NOTE — Addendum Note (Signed)
Addended by: Ernie Hew on: 07/08/2018 03:53 PM   Modules accepted: Orders

## 2018-07-26 NOTE — Progress Notes (Signed)
Cardiology Office Note   Date:  07/28/2018   ID:  Kenneth Humphrey, DOB 1961/06/01, MRN [id number]  PCP:  Sheran Fava, MD    No chief complaint on file.  CAD  Wt Readings from Last 3 Encounters:  07/28/18 216 lb (98 kg)  06/28/17 209 lb (94.8 kg)  06/04/16 203 lb 12.8 oz (92.4 kg)       History of Present Illness: Kenneth Humphrey is a 58 y.o. male  with a history of STEMI 09/2013 w/ POBA LAD.CABG w/ L-LAD, S-PDA, S-RI-OM, EF 30%, Crohn's dz, HL. EF improved to 55% by 10/2013.  EF in 12/16 was 55-60%.  In the past, he noted that: "On days he forgets his meds, he feels more energetic. BPs typically run in the 102- 120. He would like to decrease his BP meds.   He had muscle aches on the 80 of lipitor that have improved since decreasing to 40 mg daily of lipitor in 12/16. LDL was 47 in 2/17, 2 months after making the change. Ultimately changed top Praluent with atorvastatin.  Sx controlled by skipping atorvastatin occasionally and taking CoQ10. "  He continues to have some muscle and joint aches.  He sometimes skips his lipitor which helps the aches.    Denies : Chest pain. Dizziness. Leg edema. Nitroglycerin use. Orthopnea. Palpitations. Paroxysmal nocturnal dyspnea. Shortness of breath. Syncope.   Not walking for exercise as much.  Trying to stick to healthy diet.    Still works night shifts.        Past Medical History:  Diagnosis Date  . Acute myocardial infarction of other anterior wall, initial episode of care 10/15/2013  . Coronary artery disease 10/15/2013  . Crohn disease (HCC)   . Hx of echocardiogram    Echo (10/2013):  Mild LVH, EF 55-60%, mild AS hypokinesis  . Mixed hyperlipidemia   . 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 CORONARY ARTERY BYPASS GRAFT N/A 10/15/2013   Procedure: CORONARY ARTERY BYPASS  GRAFTING (CABG);  Surgeon: [name], MD;  Location: [facility] OR;  Service: Open Heart Surgery;  Laterality: N/A;  CABG x4  . HERNIA REPAIR    . LEFT HEART CATH N/A 10/15/2013   Procedure: LEFT HEART CATH;  Surgeon: Corky Crafts, MD;  Location: [facility] CATH LAB;  Service: Cardiovascular;  Laterality: N/A;  . TEE WITHOUT CARDIOVERSION N/A 10/15/2013   Procedure: TRANSESOPHAGEAL ECHOCARDIOGRAM (TEE);  Surgeon: [name], MD;  Location: [facility] OR;  Service: Open Heart Surgery;  Laterality: N/A;     Current Outpatient Medications  Medication Sig Dispense Refill  . Alirocumab (PRALUENT) 75 MG/ML SOAJ Inject 1 pen into the skin every 14 (fourteen) days. 2 pen 11  . aspirin 81 MG tablet Take 81 mg by mouth daily.    Marland Kitchen atorvastatin (LIPITOR) 80 MG tablet Take 1 tablet (80 mg total) by mouth daily. Please keep upcoming appt for future refills. Thank you. 90 tablet 0  . cefUROXime (CEFTIN) 500 MG tablet     . co-enzyme Q-10 50 MG capsule Take 100 mg by mouth daily.     Marland Kitchen ezetimibe (ZETIA) 10 MG tablet Take 1 tablet (10 mg total) by mouth daily. Please keep upcoming appt for future refills. Thank you. 90 tablet 0  . lisinopril (PRINIVIL,ZESTRIL) 2.5 MG tablet Take 1  tablet (2.5 mg total) by mouth daily. Please keep upcoming appt for future refills. Thank you. 90 tablet 0  . Multiple Vitamin (MULTI VITAMIN DAILY PO) Take 1 tablet by mouth daily.    . nitroGLYCERIN (NITROSTAT) 0.4 MG SL tablet Place 1 tablet (0.4 mg total) under the tongue every 5 (five) minutes as needed for chest pain. Please keep upcoming appt for future refills 25 tablet 3   No current facility-administered medications for this visit.     Allergies:   Fish allergy and Fish-derived products    Social History:  The patient  reports that he has never smoked. He has never used smokeless tobacco. He reports that he does not drink alcohol or use drugs.   Family History:  The patient's family history includes Cancer in his mother;  Coronary artery disease (age of onset: 76) in his father.    ROS:  Please see the history of present illness.   Otherwise, review of systems are positive for joint pains.   All other systems are reviewed and negative.    PHYSICAL EXAM: VS:  BP 114/80   Pulse 81   Ht 6\' 4"  (1.93 m)   Wt 216 lb (98 kg)   SpO2 97%   BMI 26.29 kg/m  , BMI Body mass index is 26.29 kg/m. GEN: Well nourished, well developed, in no acute distress  HEENT: normal  Neck: no JVD, carotid bruits, or masses Cardiac: RRR; no murmurs, rubs, or gallops,no edema  Respiratory:  clear to auscultation bilaterally, normal work of breathing GI: soft, nontender, nondistended, + BS MS: no deformity or atrophy  Skin: warm and dry, no rash Neuro:  Strength and sensation are intact Psych: euthymic mood, full affect   EKG:   The ekg ordered today demonstrates NSR, inferior Q waves, no ST changes   Recent Labs: 10/08/2017: ALT 48; BUN 16; Creatinine, Ser 0.96; Potassium 5.0; Sodium 140   Lipid Panel    Component Value Date/Time   CHOL 137 10/08/2017 0747   TRIG 105 10/08/2017 0747   HDL 31 (L) 10/08/2017 0747   CHOLHDL 4.4 10/08/2017 0747   CHOLHDL 2.8 08/02/2015 0812   VLDL 11 08/02/2015 0812   LDLCALC 85 10/08/2017 0747     Other studies Reviewed: Additional studies/ records that were reviewed today with results demonstrating: Labs reviewed.  LDL 85 in April 2019.   ASSESSMENT AND PLAN:  1. CAD: No angina.  Continue aggressive secondary prevention.  Doing well post CABG. 2. Old MI: No CHF. 3. Hyperlipidemia: Continue atorvastatin, Zetia and Praluent at this point.  In the past, his LDL was lower.  If his LDL goes back down, could consider decreasing atorvastatin to 40 mg daily like he was taking in 2017. 4. Joint pains: We will see if we can decrease dose of Lipitor based on labs today.   Current medicines are reviewed at length with the patient today.  The patient concerns regarding his medicines were  addressed.  The following changes have been made:  No change  Labs/ tests ordered today include:  No orders of the defined types were placed in this encounter.   Recommend 150 minutes/week of aerobic exercise Low fat, low carb, high fiber diet recommended  Disposition:   FU in 1 year   Signed, Lance Muss, MD  07/28/2018 8:25 AM    [facility] [address] [place], [place], [place]  [address] Phone: [phone]; Fax: [phone]

## 2018-07-28 ENCOUNTER — Encounter: Payer: Self-pay | Admitting: Interventional Cardiology

## 2018-07-28 ENCOUNTER — Ambulatory Visit: Payer: 59 | Admitting: Interventional Cardiology

## 2018-07-28 VITALS — BP 114/80 | HR 81 | Ht 76.0 in | Wt 216.0 lb

## 2018-07-28 DIAGNOSIS — E782 Mixed hyperlipidemia: Secondary | ICD-10-CM

## 2018-07-28 DIAGNOSIS — I2510 Atherosclerotic heart disease of native coronary artery without angina pectoris: Secondary | ICD-10-CM

## 2018-07-28 DIAGNOSIS — R5383 Other fatigue: Secondary | ICD-10-CM | POA: Diagnosis not present

## 2018-07-28 DIAGNOSIS — Z951 Presence of aortocoronary bypass graft: Secondary | ICD-10-CM

## 2018-07-28 LAB — COMPREHENSIVE METABOLIC PANEL
ALT: 56 IU/L — ABNORMAL HIGH (ref 0–44)
AST: 32 IU/L (ref 0–40)
Albumin/Globulin Ratio: 1.8 (ref 1.2–2.2)
Albumin: 4.5 g/dL (ref 3.8–4.9)
Alkaline Phosphatase: 114 IU/L (ref 39–117)
BILIRUBIN TOTAL: 0.4 mg/dL (ref 0.0–1.2)
BUN/Creatinine Ratio: 13 (ref 9–20)
BUN: 16 mg/dL (ref 6–24)
CHLORIDE: 103 mmol/L (ref 96–106)
CO2: 23 mmol/L (ref 20–29)
Calcium: 10.1 mg/dL (ref 8.7–10.2)
Creatinine, Ser: 1.21 mg/dL (ref 0.76–1.27)
GFR calc Af Amer: 75 mL/min/{1.73_m2} (ref >59)
GFR calc non Af Amer: 65 mL/min/{1.73_m2} (ref >59)
Globulin, Total: 2.5 g/dL (ref 1.5–4.5)
Glucose: 100 mg/dL — ABNORMAL HIGH (ref 65–99)
Potassium: 4.9 mmol/L (ref 3.5–5.2)
Sodium: 144 mmol/L (ref 134–144)
Total Protein: 7 g/dL (ref 6.0–8.5)

## 2018-07-28 LAB — LIPID PANEL
CHOLESTEROL TOTAL: 123 mg/dL (ref 100–199)
Chol/HDL Ratio: 3.8 ratio (ref 0.0–5.0)
HDL: 32 mg/dL — ABNORMAL LOW (ref >39)
LDL CALC: 68 mg/dL (ref 0–99)
Triglycerides: 117 mg/dL (ref 0–149)
VLDL Cholesterol Cal: 23 mg/dL (ref 5–40)

## 2018-07-28 MED ORDER — EZETIMIBE 10 MG PO TABS: 10.0000 mg | ORAL_TABLET | Freq: Every day | ORAL | 3 refills | Status: DC

## 2018-07-28 MED ORDER — LISINOPRIL 2.5 MG PO TABS: 2.5000 mg | ORAL_TABLET | Freq: Every day | ORAL | 3 refills | Status: DC

## 2018-07-28 MED ORDER — NITROGLYCERIN 0.4 MG SL SUBL: 0.4000 mg | SUBLINGUAL_TABLET | SUBLINGUAL | 3 refills | Status: AC | PRN

## 2018-07-28 MED ORDER — ATORVASTATIN CALCIUM 80 MG PO TABS: 80.0000 mg | ORAL_TABLET | Freq: Every day | ORAL | 3 refills | Status: DC

## 2018-07-28 NOTE — Patient Instructions (Signed)
Medication Instructions:  Your physician recommends that you continue on your current medications as directed. Please refer to the Current Medication list given to you today.  If you need a refill on your cardiac medications before your next appointment, please call your pharmacy.   Lab work: TODAY: CMET, LIPIDS  If you have labs (blood work) drawn today and your tests are completely normal, you will receive your results only by: . MyChart Message (if you have MyChart) OR . A paper copy in the mail If you have any lab test that is abnormal or we need to change your treatment, we will call you to review the results.  Testing/Procedures: None ordered  Follow-Up: At [facility], you and your health needs are our priority.  As part of our continuing mission to provide you with exceptional heart care, we have created designated Provider Care Teams.  These Care Teams include your primary Cardiologist (physician) and Advanced Practice Providers (APPs -  Physician Assistants and Nurse Practitioners) who all work together to provide you with the care you need, when you need it. . You will need a follow up appointment in 1 year.  Please call our office 2 months in advance to schedule this appointment.  You may see Jay Varanasi, MD or one of the following Advanced Practice Providers on your designated Care Team:   . Brittainy Simmons, PA-C . Dayna Dunn, PA-C . Michele Lenze, PA-C  Any Other Special Instructions Will Be Listed Below (If Applicable).    

## 2018-07-29 ENCOUNTER — Telehealth: Payer: Self-pay | Admitting: Interventional Cardiology

## 2018-07-29 ENCOUNTER — Telehealth: Payer: Self-pay

## 2018-07-29 DIAGNOSIS — E782 Mixed hyperlipidemia: Secondary | ICD-10-CM

## 2018-07-29 MED ORDER — ATORVASTATIN CALCIUM 80 MG PO TABS: 40.0000 mg | ORAL_TABLET | Freq: Every day | ORAL | 3 refills | Status: DC

## 2018-07-29 NOTE — Telephone Encounter (Signed)
Called and made patient aware of lab results and recommendations to decrease atorvastatin to 40 mg QD and repeat LIPIDS, LFTS in 3 months. Patient verbalized understanding. Rx updated and fasting lab appointment made for 4/29.

## 2018-07-29 NOTE — Telephone Encounter (Signed)
New message  ° ° ° ° °Pt returning call for lab results  °

## 2018-07-29 NOTE — Telephone Encounter (Signed)
-----   Message from Corky Crafts, MD sent at 07/28/2018  5:03 PM EST ----- I would be ok with trying atorvastatin 40 mg daily down from 80 mg since he is having muscle pains and ALT slightly up; LDL better.  Recheck lipids in 3 months, COntinue Praluent.

## 2018-07-29 NOTE — Telephone Encounter (Signed)
See other telephone encounter from today

## 2018-10-24 ENCOUNTER — Telehealth: Payer: Self-pay

## 2018-10-24 NOTE — Telephone Encounter (Signed)
Spoke to the patient and gave him our lab protocol.  He is scheduled Wednesday 4/29.  He verbalized understanding.

## 2018-10-26 ENCOUNTER — Other Ambulatory Visit: Payer: 59 | Admitting: *Deleted

## 2018-10-26 ENCOUNTER — Other Ambulatory Visit: Payer: Self-pay

## 2018-10-26 DIAGNOSIS — E782 Mixed hyperlipidemia: Secondary | ICD-10-CM

## 2018-10-26 LAB — HEPATIC FUNCTION PANEL
ALT: 43 IU/L (ref 0–44)
AST: 31 IU/L (ref 0–40)
Albumin: 4.5 g/dL (ref 3.8–4.9)
Alkaline Phosphatase: 99 IU/L (ref 39–117)
Bilirubin Total: 0.5 mg/dL (ref 0.0–1.2)
Bilirubin, Direct: 0.14 mg/dL (ref 0.00–0.40)
Total Protein: 6.8 g/dL (ref 6.0–8.5)

## 2018-10-26 LAB — LIPID PANEL
Chol/HDL Ratio: 5 ratio (ref 0.0–5.0)
Cholesterol, Total: 140 mg/dL (ref 100–199)
HDL: 28 mg/dL — ABNORMAL LOW (ref >39)
LDL Calculated: 86 mg/dL (ref 0–99)
Triglycerides: 130 mg/dL (ref 0–149)
VLDL Cholesterol Cal: 26 mg/dL (ref 5–40)

## 2019-07-24 ENCOUNTER — Other Ambulatory Visit: Payer: Self-pay | Admitting: Pharmacist

## 2019-07-24 MED ORDER — PRALUENT 75 MG/ML ~~LOC~~ SOAJ: 1.0000 "pen " | SUBCUTANEOUS | 11 refills | Status: DC

## 2019-07-25 ENCOUNTER — Other Ambulatory Visit: Payer: Self-pay | Admitting: Pharmacist

## 2019-07-25 MED ORDER — PRALUENT 75 MG/ML ~~LOC~~ SOAJ: 1.0000 "pen " | SUBCUTANEOUS | 11 refills | Status: DC

## 2019-07-31 NOTE — Progress Notes (Signed)
Cardiology Office Note   Date:  08/02/2019   ID:  Kenneth Humphrey, DOB 07-15-1960, MRN [id number]  PCP:  Patient, No Pcp Per    No chief complaint on file.  Coronary artery disease  Wt Readings from Last 3 Encounters:  08/02/19 218 lb 12.8 oz (99.2 kg)  07/28/18 216 lb (98 kg)  06/28/17 209 lb (94.8 kg)       History of Present Illness: Kenneth Humphrey is a 59 y.o. male  with a history of STEMI 09/2013 w/ POBA LAD.CABG w/ L-LAD, S-PDA, S-RI-OM, EF 30%, Crohn's dz, HL. EF improved to 55% by 10/2013.  EF in 12/16 was 55-60%.  In the past, he noted that: "On days he forgets his meds, he feels more energetic. BPs typically run in the 102- 120. He would like to decrease his BP meds.   He had muscle aches on the 80 of lipitor that have improved since decreasing to 40 mg daily of lipitor in 12/16. LDL was 47 in 2/17, 2 months after making the change.Ultimately changed top Praluent with atorvastatin. Sx controlled by skipping atorvastatin occasionally and taking CoQ10. "  He continues to have some muscle and joint aches.  He sometimes skips his lipitor which helps the aches.    Works night shifts.  Since the last visit, he has had aches in his legs, back and shoulders. He attributes this to his generic atorvastatin.  He has stopped the atorvastatin and feels that his sx have improved.  Denies : Chest pain. Dizziness. Leg edema. Nitroglycerin use. Orthopnea. Palpitations. Paroxysmal nocturnal dyspnea. Shortness of breath. Syncope.    Past Medical History:  Diagnosis Date  . Acute myocardial infarction of other anterior wall, initial episode of care 10/15/2013  . Coronary artery disease 10/15/2013  . Crohn disease (HCC)   . Hx of echocardiogram    Echo (10/2013):  Mild LVH, EF 55-60%, mild AS hypokinesis  . Mixed hyperlipidemia   . 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 CORONARY ARTERY BYPASS GRAFT N/A 10/15/2013   Procedure: CORONARY ARTERY BYPASS GRAFTING (CABG);  Surgeon: [name], MD;  Location: [facility] OR;  Service: Open Heart Surgery;  Laterality: N/A;  CABG x4  . HERNIA REPAIR    . LEFT HEART CATH N/A 10/15/2013   Procedure: LEFT HEART CATH;  Surgeon: Corky Crafts, MD;  Location: [facility] CATH LAB;  Service: Cardiovascular;  Laterality: N/A;  . TEE WITHOUT CARDIOVERSION N/A 10/15/2013   Procedure: TRANSESOPHAGEAL ECHOCARDIOGRAM (TEE);  Surgeon: [name], MD;  Location: [facility] OR;  Service: Open Heart Surgery;  Laterality: N/A;     Current Outpatient Medications  Medication Sig Dispense Refill  . Alirocumab (PRALUENT) 75 MG/ML SOAJ Inject 1 pen into the skin every 14 (fourteen) days. 2 pen 11  . aspirin 81 MG tablet Take 81 mg by mouth daily.    Marland Kitchen atorvastatin (LIPITOR) 80 MG tablet Take 0.5 tablets (40 mg total) by mouth daily. 90 tablet 3  . cefUROXime (CEFTIN) 500 MG tablet     . co-enzyme Q-10 50 MG capsule Take 100 mg by mouth daily.     Marland Kitchen ezetimibe (ZETIA) 10 MG tablet Take 1 tablet (10 mg total) by mouth daily. 90 tablet 3  . lisinopril (PRINIVIL,ZESTRIL) 2.5 MG tablet Take 1 tablet (2.5 mg total) by  mouth daily. 90 tablet 3  . Multiple Vitamin (MULTI VITAMIN DAILY PO) Take 1 tablet by mouth daily.    . nitroGLYCERIN (NITROSTAT) 0.4 MG SL tablet Place 1 tablet (0.4 mg total) under the tongue every 5 (five) minutes as needed for chest pain. 25 tablet 3   No current facility-administered medications for this visit.    Allergies:   Fish allergy and Fish-derived products    Social History:  The patient  reports that he has never smoked. He has never used smokeless tobacco. He reports that he does not drink alcohol or use drugs.   Family History:  The patient's family history includes Cancer in his mother; Coronary artery disease (age of onset: 47) in his father.    ROS:  Please see the history of  present illness.   Otherwise, review of systems are positive for muscle aches on atorvastatin.   All other systems are reviewed and negative.    PHYSICAL EXAM: VS:  BP 122/80   Pulse 62   Ht 6\' 4"  (1.93 m)   Wt 218 lb 12.8 oz (99.2 kg)   SpO2 97%   BMI 26.63 kg/m  , BMI Body mass index is 26.63 kg/m. GEN: Well nourished, well developed, in no acute distress  HEENT: normal  Neck: no JVD, carotid bruits, or masses Cardiac: RRR; no murmurs, rubs, or gallops,no edema  Respiratory:  clear to auscultation bilaterally, normal work of breathing GI: soft, nontender, nondistended, + BS MS: no deformity or atrophy  Skin: warm and dry, no rash Neuro:  Strength and sensation are intact Psych: euthymic mood, full affect   EKG:   The ekg ordered today demonstrates NSR, no ST changes   Recent Labs: 10/26/2018: ALT 43   Lipid Panel    Component Value Date/Time   CHOL 140 10/26/2018 0728   TRIG 130 10/26/2018 0728   HDL 28 (L) 10/26/2018 0728   CHOLHDL 5.0 10/26/2018 0728   CHOLHDL 2.8 08/02/2015 0812   VLDL 11 08/02/2015 0812   LDLCALC 86 10/26/2018 0728     Other studies Reviewed: Additional studies/ records that were reviewed today with results demonstrating: labs reviewed.   ASSESSMENT AND PLAN:  1. CAD: Post CABG.  No angina. Continue aggressive secondary prevention. Avoid processed foods. 2. 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  He has not had any heart failure clinically. Hyperlipidemia: Continue Praluent and Zetia.  He stopped the atorvastatin due to muscle aches.  Will check lipids today since he has been of of the lipitor.  Feels better off of atorvastatin. HTN: The current medical regimen is effective;  continue present plan and medications.  He wears his mask and distances.  Current medicines are reviewed at length with the patient today.  The patient concerns regarding his medicines were addressed.  The following changes have been made:  Stopped atorvastatin  Labs/ tests  ordered today include:  No orders of the defined types were placed in this encounter.   Recommend 150 minutes/week of aerobic exercise Low fat, low carb, high fiber diet recommended  Disposition:   FU in 1 year   Signed, Larae Grooms, MD  08/02/2019 [facility] [place], [place], [place]  [address] Phone: [phone]; Fax: [phone]

## 2019-08-02 ENCOUNTER — Encounter: Payer: Self-pay | Admitting: Interventional Cardiology

## 2019-08-02 ENCOUNTER — Other Ambulatory Visit: Payer: Self-pay

## 2019-08-02 ENCOUNTER — Ambulatory Visit: Payer: 59 | Admitting: Interventional Cardiology

## 2019-08-02 VITALS — BP 122/80 | HR 62 | Ht 76.0 in | Wt 218.8 lb

## 2019-08-02 DIAGNOSIS — I252 Old myocardial infarction: Secondary | ICD-10-CM | POA: Diagnosis not present

## 2019-08-02 DIAGNOSIS — E782 Mixed hyperlipidemia: Secondary | ICD-10-CM

## 2019-08-02 DIAGNOSIS — Z951 Presence of aortocoronary bypass graft: Secondary | ICD-10-CM | POA: Diagnosis not present

## 2019-08-02 DIAGNOSIS — I2510 Atherosclerotic heart disease of native coronary artery without angina pectoris: Secondary | ICD-10-CM | POA: Diagnosis not present

## 2019-08-02 LAB — COMPREHENSIVE METABOLIC PANEL
ALT: 30 IU/L (ref 0–44)
AST: 23 IU/L (ref 0–40)
Albumin/Globulin Ratio: 1.7 (ref 1.2–2.2)
Albumin: 4.3 g/dL (ref 3.8–4.9)
Alkaline Phosphatase: 97 IU/L (ref 39–117)
BUN/Creatinine Ratio: 16 (ref 10–24)
BUN: 17 mg/dL (ref 8–27)
Bilirubin Total: 0.3 mg/dL (ref 0.0–1.2)
CO2: 22 mmol/L (ref 20–29)
Calcium: 9.8 mg/dL (ref 8.6–10.2)
Chloride: 101 mmol/L (ref 96–106)
Creatinine, Ser: 1.09 mg/dL (ref 0.76–1.27)
GFR calc Af Amer: 85 mL/min/{1.73_m2} (ref >59)
GFR calc non Af Amer: 73 mL/min/{1.73_m2} (ref >59)
Globulin, Total: 2.5 g/dL (ref 1.5–4.5)
Glucose: 95 mg/dL (ref 65–99)
Potassium: 4.5 mmol/L (ref 3.5–5.2)
Sodium: 139 mmol/L (ref 134–144)
Total Protein: 6.8 g/dL (ref 6.0–8.5)

## 2019-08-02 LAB — LIPID PANEL
Chol/HDL Ratio: 6.8 ratio — ABNORMAL HIGH (ref 0.0–5.0)
Cholesterol, Total: 196 mg/dL (ref 100–199)
HDL: 29 mg/dL — ABNORMAL LOW (ref >39)
LDL Chol Calc (NIH): 123 mg/dL — ABNORMAL HIGH (ref 0–99)
Triglycerides: 249 mg/dL — ABNORMAL HIGH (ref 0–149)
VLDL Cholesterol Cal: 44 mg/dL — ABNORMAL HIGH (ref 5–40)

## 2019-08-02 NOTE — Patient Instructions (Signed)
Medication Instructions:  Your physician recommends that you continue on your current medications as directed. Please refer to the Current Medication list given to you today.  *If you need a refill on your cardiac medications before your next appointment, please call your pharmacy*  Lab Work: TODAY: CMET, LIPIDS  If you have labs (blood work) drawn today and your tests are completely normal, you will receive your results only by: Marland Kitchen MyChart Message (if you have MyChart) OR . A paper copy in the mail If you have any lab test that is abnormal or we need to change your treatment, we will call you to review the results.  Testing/Procedures: None ordered  Follow-Up: At [facility], you and your health needs are our priority.  As part of our continuing mission to provide you with exceptional heart care, we have created designated Provider Care Teams.  These Care Teams include your primary Cardiologist (physician) and Advanced Practice Providers (APPs -  Physician Assistants and Nurse Practitioners) who all work together to provide you with the care you need, when you need it.  Your next appointment:   12 month(s)  The format for your next appointment:   In Person  Provider:   You may see Lance Muss, MD or one of the following Advanced Practice Providers on your designated Care Team:    Ronie Spies, PA-C  Jacolyn Reedy, PA-C   Other Instructions

## 2019-08-04 ENCOUNTER — Other Ambulatory Visit: Payer: Self-pay

## 2019-08-04 DIAGNOSIS — E782 Mixed hyperlipidemia: Secondary | ICD-10-CM

## 2019-08-04 MED ORDER — ROSUVASTATIN CALCIUM 20 MG PO TABS: 20.0000 mg | ORAL_TABLET | Freq: Every day | ORAL | 3 refills | Status: DC

## 2019-10-23 ENCOUNTER — Other Ambulatory Visit: Payer: Self-pay | Admitting: Interventional Cardiology

## 2019-10-23 MED ORDER — EZETIMIBE 10 MG PO TABS: 10.0000 mg | ORAL_TABLET | Freq: Every day | ORAL | 3 refills | Status: DC

## 2019-10-23 MED ORDER — LISINOPRIL 2.5 MG PO TABS: 2.5000 mg | ORAL_TABLET | Freq: Every day | ORAL | 3 refills | Status: DC

## 2019-10-23 NOTE — Telephone Encounter (Signed)
Pt's medication was sent to pt's pharmacy as requested. Confirmation received.  °

## 2019-10-23 NOTE — Telephone Encounter (Signed)
*  STAT* If patient is at the pharmacy, call can be transferred to refill team.   1. Which medications need to be refilled? (please list name of each medication and dose if known) New prescriptions for Lisinopril and Ezetimibe  2. Which pharmacy/location (including street and city if local pharmacy) is medication to be sent to? [facility]  3. Do they need a 30 day or 90 day supply? 90 days and refills

## 2019-11-03 ENCOUNTER — Other Ambulatory Visit: Payer: Self-pay

## 2019-11-03 ENCOUNTER — Other Ambulatory Visit: Payer: 59 | Admitting: *Deleted

## 2019-11-03 DIAGNOSIS — E782 Mixed hyperlipidemia: Secondary | ICD-10-CM

## 2019-11-03 LAB — HEPATIC FUNCTION PANEL
ALT: 32 IU/L (ref 0–44)
AST: 29 IU/L (ref 0–40)
Albumin: 4.1 g/dL (ref 3.8–4.9)
Alkaline Phosphatase: 81 IU/L (ref 39–117)
Bilirubin Total: 0.3 mg/dL (ref 0.0–1.2)
Bilirubin, Direct: 0.1 mg/dL (ref 0.00–0.40)
Total Protein: 6.5 g/dL (ref 6.0–8.5)

## 2019-11-03 LAB — LIPID PANEL
Chol/HDL Ratio: 4.1 ratio (ref 0.0–5.0)
Cholesterol, Total: 120 mg/dL (ref 100–199)
HDL: 29 mg/dL — ABNORMAL LOW (ref >39)
LDL Chol Calc (NIH): 76 mg/dL (ref 0–99)
Triglycerides: 76 mg/dL (ref 0–149)
VLDL Cholesterol Cal: 15 mg/dL (ref 5–40)

## 2020-06-22 ENCOUNTER — Other Ambulatory Visit: Payer: Self-pay | Admitting: Interventional Cardiology

## 2020-06-25 ENCOUNTER — Telehealth: Payer: Self-pay

## 2020-06-25 NOTE — Telephone Encounter (Signed)
Pa submitted for praluent 75mg 

## 2020-06-25 NOTE — Telephone Encounter (Signed)
Called and lmom pt to give [place] a call back because we need his drug insurance information to process pa

## 2020-06-25 NOTE — Telephone Encounter (Signed)
Patient called back.  [facility]  ID: [id number] BIN: [id number] PCN: [id number] GRP: [id number]

## 2020-06-26 ENCOUNTER — Telehealth: Payer: Self-pay | Admitting: Interventional Cardiology

## 2020-06-26 NOTE — Telephone Encounter (Signed)
    Pt would like to speak with Dr. Hoyle Barr nurse. He said, it's personal

## 2020-06-28 ENCOUNTER — Other Ambulatory Visit: Payer: Self-pay | Admitting: Interventional Cardiology

## 2020-07-04 ENCOUNTER — Telehealth: Payer: Self-pay | Admitting: Interventional Cardiology

## 2020-07-04 NOTE — Telephone Encounter (Signed)
Left message for patient to call back  

## 2020-07-04 NOTE — Telephone Encounter (Signed)
New message:     Patient returning a call stating that some one called him. I did not see a note. Please call patient.

## 2020-07-05 NOTE — Telephone Encounter (Addendum)
Left message for the pt that we were returning his call and to have him give [place] a call back at his convenience.   There is another open encounter re: this call will close this out and note on other encounter.

## 2020-07-05 NOTE — Telephone Encounter (Signed)
Left message for the pt that we were returning his call and to have him give [place] a call back at his convenience.

## 2020-07-31 NOTE — Progress Notes (Signed)
Cardiology Office Note   Date:  08/02/2020   ID:  Kenneth Humphrey, DOB May 23, 1961, MRN [id number]  PCP:  Shireen Quan    No chief complaint on file.  CAD  Wt Readings from Last 3 Encounters:  08/02/20 220 lb (99.8 kg)  08/02/19 218 lb 12.8 oz (99.2 kg)  07/28/18 216 lb (98 kg)       History of Present Illness: Kenneth Humphrey is a 60 y.o. male    with a history of STEMI 09/2013 w/ POBA LAD.CABG w/ L-LAD, S-PDA, S-RI-OM, EF 30%, Crohn's dz, HL. EF improved to 55% by 10/2013.  EF in 12/16 was 55-60%.  In the past, he noted that: "On days he forgets his meds, he feels more energetic. BPs typically run in the 102- 120. He would like to decrease his BP meds.   He had muscle aches on the 80 of lipitor that have improved since decreasing to 40 mg daily of lipitor in 12/16. LDL was 47 in 2/17, 2 months after making the change.Ultimately changed top Praluent with atorvastatin. Sx controlled by skipping atorvastatin occasionally and taking CoQ10."  He continues to have some muscle and joint aches. He sometimes skips his lipitor which helps the aches.   Works night shifts.  He has had aches in his legs, back and shoulders. He attributes this to his generic atorvastatin.  He has stopped the atorvastatin and feels that his sx improved.  Switched to praluent and Zetia.  Tolerating this well.  Has had some left upper quadrant pain based on eating.  No t exertional.  Different from prior angina.   Denies : Chest pain. Dizziness. Leg edema. Nitroglycerin use. Orthopnea. Palpitations. Paroxysmal nocturnal dyspnea. Shortness of breath. Syncope.  Occasional low BPs first thing in the morning.  After he gets up and waits 15 minutes, it is back to normal after that time.  He has avoided COVID and had all of his injections.   Works for [employer], Neurosurgeon- hoping to retire in 1 year.   Past Medical History:  Diagnosis Date  . Acute myocardial infarction of other  anterior wall, initial episode of care 10/15/2013  . Coronary artery disease 10/15/2013  . Crohn disease (HCC)   . Hx of echocardiogram    Echo (10/2013):  Mild LVH, EF 55-60%, mild AS hypokinesis  . Mixed hyperlipidemia   . 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 CORONARY ARTERY BYPASS GRAFT N/A 10/15/2013   Procedure: CORONARY ARTERY BYPASS GRAFTING (CABG);  Surgeon: [name], MD;  Location: [facility] OR;  Service: Open Heart Surgery;  Laterality: N/A;  CABG x4  . HERNIA REPAIR    . LEFT HEART CATH N/A 10/15/2013   Procedure: LEFT HEART CATH;  Surgeon: Corky Crafts, MD;  Location: [facility] CATH LAB;  Service: Cardiovascular;  Laterality: N/A;  . TEE WITHOUT CARDIOVERSION N/A 10/15/2013   Procedure: TRANSESOPHAGEAL ECHOCARDIOGRAM (TEE);  Surgeon: [name], MD;  Location: [facility] OR;  Service: Open Heart Surgery;  Laterality: N/A;     Current Outpatient Medications  Medication Sig Dispense Refill  . aspirin 81 MG tablet Take 81 mg by mouth daily.    Marland Kitchen co-enzyme Q-10 50 MG capsule Take 100 mg by mouth daily.     Marland Kitchen ezetimibe (ZETIA) 10 MG tablet Take 1 tablet (10  mg total) by mouth daily. 90 tablet 3  . lisinopril (ZESTRIL) 2.5 MG tablet Take 1 tablet (2.5 mg total) by mouth daily. 90 tablet 3  . Multiple Vitamin (MULTI VITAMIN DAILY PO) Take 1 tablet by mouth daily.    . nitroGLYCERIN (NITROSTAT) 0.4 MG SL tablet Place 1 tablet (0.4 mg total) under the tongue every 5 (five) minutes as needed for chest pain. 25 tablet 3  . PRALUENT 75 MG/ML SOAJ INJECT 1 PEN INTO THE SKIN EVERY 14 DAYS 2 mL 11  . rosuvastatin (CRESTOR) 20 MG tablet TAKE 1 TABLET DAILY 90 tablet 0   No current facility-administered medications for this visit.    Allergies:   Fish allergy and Fish-derived products    Social History:  The patient  reports that he has never smoked. He has  never used smokeless tobacco. He reports that he does not drink alcohol and does not use drugs.   Family History:  The patient's family history includes Cancer in his mother; Coronary artery disease (age of onset: 39) in his father.    ROS:  Please see the history of present illness.   Otherwise, review of systems are positive for Crohns flare up- diet controlled.   All other systems are reviewed and negative.    PHYSICAL EXAM: VS:  BP 138/82   Pulse 72   Ht 6\' 4"  (1.93 m)   Wt 220 lb (99.8 kg)   SpO2 97%   BMI 26.78 kg/m  , BMI Body mass index is 26.78 kg/m. GEN: Well nourished, well developed, in no acute distress  HEENT: normal  Neck: no JVD, carotid bruits, or masses Cardiac: RRR; no murmurs, rubs, or gallops,no edema  Respiratory:  clear to auscultation bilaterally, normal work of breathing GI: soft, nontender, nondistended, + BS MS: no deformity or atrophy ; 2+ PT pulses bilaterally Skin: warm and dry, no rash Neuro:  Strength and sensation are intact Psych: euthymic mood, full affect   EKG:   The ekg ordered today demonstrates NSR, no ST changes   Recent Labs: 11/03/2019: ALT 32   Lipid Panel    Component Value Date/Time   CHOL 120 11/03/2019 0735   TRIG 76 11/03/2019 0735   HDL 29 (L) 11/03/2019 0735   CHOLHDL 4.1 11/03/2019 0735   CHOLHDL 2.8 08/02/2015 0812   VLDL 11 08/02/2015 0812   LDLCALC 76 11/03/2019 0735     Other studies Reviewed: Additional studies/ records that were reviewed today with results demonstrating: labs reviewed.     ASSESSMENT AND PLAN:  1. CAD: No angina. Continue aggressive secondary prevention.  2. Old MI: No CHF.   3. Hyperlipidemia: LDL 76 in 5/21.  COntinue Praluent and Zetia. Check labs today. Healthy diet. 4. HTN: The current medical regimen is effective;  continue present plan and medications.  Stay well hydrated. 5. Elevated blood sugar: A1C was 6.6 in 2015.  Fasting Glucose was 104 in 2019.  Whole food, plant based diet  recommended.   Current medicines are reviewed at length with the patient today.  The patient concerns regarding his medicines were addressed.  The following changes have been made:  No change  Labs/ tests ordered today include:  No orders of the defined types were placed in this encounter.   Recommend 150 minutes/week of aerobic exercise Low fat, low carb, high fiber diet recommended  Disposition:   FU in 1 year   Signed, 2020, MD  08/02/2020 8:19 AM    [facility] [address], [place], [place]  [address] Phone: [phone]; Fax: [phone]

## 2020-08-02 ENCOUNTER — Other Ambulatory Visit: Payer: Self-pay

## 2020-08-02 ENCOUNTER — Encounter: Payer: Self-pay | Admitting: Interventional Cardiology

## 2020-08-02 ENCOUNTER — Ambulatory Visit: Payer: 59 | Admitting: Interventional Cardiology

## 2020-08-02 VITALS — BP 138/82 | HR 72 | Ht 76.0 in | Wt 220.0 lb

## 2020-08-02 DIAGNOSIS — R7309 Other abnormal glucose: Secondary | ICD-10-CM

## 2020-08-02 DIAGNOSIS — Z951 Presence of aortocoronary bypass graft: Secondary | ICD-10-CM | POA: Diagnosis not present

## 2020-08-02 DIAGNOSIS — I2510 Atherosclerotic heart disease of native coronary artery without angina pectoris: Secondary | ICD-10-CM

## 2020-08-02 DIAGNOSIS — I252 Old myocardial infarction: Secondary | ICD-10-CM | POA: Diagnosis not present

## 2020-08-02 DIAGNOSIS — E782 Mixed hyperlipidemia: Secondary | ICD-10-CM

## 2020-08-02 NOTE — Patient Instructions (Signed)
Medication Instructions:  Your physician recommends that you continue on your current medications as directed. Please refer to the Current Medication list given to you today.  *If you need a refill on your cardiac medications before your next appointment, please call your pharmacy*   Lab Work: TODAY: CBC, CMET, FLP, A1C  If you have labs (blood work) drawn today and your tests are completely normal, you will receive your results only by: Marland Kitchen MyChart Message (if you have MyChart) OR . A paper copy in the mail If you have any lab test that is abnormal or we need to change your treatment, we will call you to review the results.   Testing/Procedures: NONE   Follow-Up: At [facility], you and your health needs are our priority.  As part of our continuing mission to provide you with exceptional heart care, we have created designated Provider Care Teams.  These Care Teams include your primary Cardiologist (physician) and Advanced Practice Providers (APPs -  Physician Assistants and Nurse Practitioners) who all work together to provide you with the care you need, when you need it.  We recommend signing up for the patient portal called "MyChart".  Sign up information is provided on this After Visit Summary.  MyChart is used to connect with patients for Virtual Visits (Telemedicine).  Patients are able to view lab/test results, encounter notes, upcoming appointments, etc.  Non-urgent messages can be sent to your provider as well.   To learn more about what you can do with MyChart, go to [email or web address].    Your next appointment:   12 month(s)  The format for your next appointment:   In Person  Provider:   You may see Lance Muss, MD or one of the following Advanced Practice Providers on your designated Care Team:    Ronie Spies, PA-C  Jacolyn Reedy, PA-C    Other Instructions  High-Fiber Eating Plan Fiber, also called dietary fiber, is a type of carbohydrate. It is  found foods such as fruits, vegetables, whole grains, and beans. A high-fiber diet can have many health benefits. Your health care provider may recommend a high-fiber diet to help:  Prevent constipation. Fiber can make your bowel movements more regular.  Lower your cholesterol.  Relieve the following conditions: ? Inflammation of veins in the anus (hemorrhoids). ? Inflammation of specific areas of the digestive tract (uncomplicated diverticulosis). ? A problem of the large intestine, also called the colon, that sometimes causes pain and diarrhea (irritable bowel syndrome, or IBS).  Prevent overeating as part of a weight-loss plan.  Prevent heart disease, type 2 diabetes, and certain cancers. What are tips for following this plan? Reading food labels  Check the nutrition facts label on food products for the amount of dietary fiber. Choose foods that have 5 grams of fiber or more per serving.  The goals for recommended daily fiber intake include: ? Men (age 44 or younger): 34-38 g. ? Men (over age 75): 28-34 g. ? Women (age 84 or younger): 25-28 g. ? Women (over age 20): 22-25 g. Your daily fiber goal is _____________ g.   Shopping  Choose whole fruits and vegetables instead of processed forms, such as apple juice or applesauce.  Choose a wide variety of high-fiber foods such as avocados, lentils, oats, and kidney beans.  Read the nutrition facts label of the foods you choose. Be aware of foods with added fiber. These foods often have high sugar and sodium amounts per serving. Cooking  Use whole-grain flour  for baking and cooking.  Cook with brown rice instead of white rice. Meal planning  Start the day with a breakfast that is high in fiber, such as a cereal that contains 5 g of fiber or more per serving.  Eat breads and cereals that are made with whole-grain flour instead of refined flour or white flour.  Eat brown rice, bulgur wheat, or millet instead of white rice.  Use  beans in place of meat in soups, salads, and pasta dishes.  Be sure that half of the grains you eat each day are whole grains. General information  You can get the recommended daily intake of dietary fiber by: ? Eating a variety of fruits, vegetables, grains, nuts, and beans. ? Taking a fiber supplement if you are not able to take in enough fiber in your diet. It is better to get fiber through food than from a supplement.  Gradually increase how much fiber you consume. If you increase your intake of dietary fiber too quickly, you may have bloating, cramping, or gas.  Drink plenty of water to help you digest fiber.  Choose high-fiber snacks, such as berries, raw vegetables, nuts, and popcorn. What foods should I eat? Fruits Berries. Pears. Apples. Oranges. Avocado. Prunes and raisins. Dried figs. Vegetables Sweet potatoes. Spinach. Kale. Artichokes. Cabbage. Broccoli. Cauliflower. Green peas. Carrots. Squash. Grains Whole-grain breads. Multigrain cereal. Oats and oatmeal. Brown rice. Barley. Bulgur wheat. [place]. Quinoa. Bran muffins. Popcorn. Rye wafer crackers. Meats and other proteins Navy beans, kidney beans, and pinto beans. Soybeans. Split peas. Lentils. Nuts and seeds. Dairy Fiber-fortified yogurt. Beverages Fiber-fortified soy milk. Fiber-fortified orange juice. Other foods Fiber bars. The items listed above may not be a complete list of recommended foods and beverages. Contact a dietitian for more information. What foods should I avoid? Fruits Fruit juice. Cooked, strained fruit. Vegetables Fried potatoes. Canned vegetables. Well-cooked vegetables. Grains White bread. Pasta made with refined flour. White rice. Meats and other proteins Fatty cuts of meat. Fried chicken or fried fish. Dairy Milk. Yogurt. Cream cheese. Sour cream. Fats and oils Butters. Beverages Soft drinks. Other foods Cakes and pastries. The items listed above may not be a complete list of foods  and beverages to avoid. Talk with your dietitian about what choices are best for you. Summary  Fiber is a type of carbohydrate. It is found in foods such as fruits, vegetables, whole grains, and beans.  A high-fiber diet has many benefits. It can help to prevent constipation, lower blood cholesterol, aid weight loss, and reduce your risk of heart disease, diabetes, and certain cancers.  Increase your intake of fiber gradually. Increasing fiber too quickly may cause cramping, bloating, and gas. Drink plenty of water while you increase the amount of fiber you consume.  The best sources of fiber include whole fruits and vegetables, whole grains, nuts, seeds, and beans. This information is not intended to replace advice given to you by your health care provider. Make sure you discuss any questions you have with your health care provider. Document Revised: 10/19/2019 Document Reviewed: 10/19/2019 Elsevier Patient Education  2021 [employer].

## 2020-08-03 LAB — COMPREHENSIVE METABOLIC PANEL
ALT: 51 IU/L — ABNORMAL HIGH (ref 0–44)
AST: 36 IU/L (ref 0–40)
Albumin/Globulin Ratio: 1.6 (ref 1.2–2.2)
Albumin: 4.2 g/dL (ref 3.8–4.8)
Alkaline Phosphatase: 92 IU/L (ref 44–121)
BUN/Creatinine Ratio: 10 (ref 10–24)
BUN: 11 mg/dL (ref 8–27)
Bilirubin Total: 0.4 mg/dL (ref 0.0–1.2)
CO2: 21 mmol/L (ref 20–29)
Calcium: 9.9 mg/dL (ref 8.6–10.2)
Chloride: 103 mmol/L (ref 96–106)
Creatinine, Ser: 1.13 mg/dL (ref 0.76–1.27)
GFR calc Af Amer: 81 mL/min/{1.73_m2} (ref >59)
GFR calc non Af Amer: 70 mL/min/{1.73_m2} (ref >59)
Globulin, Total: 2.6 g/dL (ref 1.5–4.5)
Glucose: 115 mg/dL — ABNORMAL HIGH (ref 65–99)
Potassium: 4.7 mmol/L (ref 3.5–5.2)
Sodium: 137 mmol/L (ref 134–144)
Total Protein: 6.8 g/dL (ref 6.0–8.5)

## 2020-08-03 LAB — CBC
Hematocrit: 44.9 % (ref 37.5–51.0)
Hemoglobin: 15.4 g/dL (ref 13.0–17.7)
MCH: 30.4 pg (ref 26.6–33.0)
MCHC: 34.3 g/dL (ref 31.5–35.7)
MCV: 89 fL (ref 79–97)
Platelets: 310 10*3/uL (ref 150–450)
RBC: 5.06 x10E6/uL (ref 4.14–5.80)
RDW: 11.9 % (ref 11.6–15.4)
WBC: 10.2 10*3/uL (ref 3.4–10.8)

## 2020-08-03 LAB — LIPID PANEL
Chol/HDL Ratio: 5.7 ratio — ABNORMAL HIGH (ref 0.0–5.0)
Cholesterol, Total: 166 mg/dL (ref 100–199)
HDL: 29 mg/dL — ABNORMAL LOW (ref >39)
LDL Chol Calc (NIH): 94 mg/dL (ref 0–99)
Triglycerides: 251 mg/dL — ABNORMAL HIGH (ref 0–149)
VLDL Cholesterol Cal: 43 mg/dL — ABNORMAL HIGH (ref 5–40)

## 2020-08-03 LAB — HEMOGLOBIN A1C
Est. average glucose Bld gHb Est-mCnc: 137 mg/dL
Hgb A1c MFr Bld: 6.4 % — ABNORMAL HIGH (ref 4.8–5.6)

## 2020-08-06 ENCOUNTER — Telehealth: Payer: Self-pay | Admitting: Pharmacist

## 2020-08-06 NOTE — Telephone Encounter (Signed)
Discussed labs with patient. TG are up. Patient will work on cutting out carbs and sugars. Rarely drinks alcohol. Also will increase his exercise. LDL up as well. Patient would like to work on diet and exercise before increasing Praluent to 150mg . Repeat labs in 3 months.

## 2020-08-13 NOTE — Telephone Encounter (Signed)
Patient has no concerns at this time

## 2020-09-11 ENCOUNTER — Other Ambulatory Visit: Payer: Self-pay | Admitting: Interventional Cardiology

## 2020-10-29 ENCOUNTER — Telehealth: Payer: Self-pay

## 2020-10-29 DIAGNOSIS — E782 Mixed hyperlipidemia: Secondary | ICD-10-CM

## 2020-10-29 NOTE — Telephone Encounter (Signed)
-----   Message from Olene Floss, RPH-CPP sent at 10/29/2020  7:19 AM EDT -----  ----- Message ----- From: Olene Floss, RPH-CPP Sent: 10/29/2020  12:00 AM EDT To: Olene Floss, RPH-CPP  Set up lipids

## 2020-10-29 NOTE — Addendum Note (Signed)
Addended by: Malena Peer D on: 10/29/2020 09:00 AM   Modules accepted: Orders

## 2020-10-29 NOTE — Telephone Encounter (Signed)
lmom the pt to call to schedule lipid and hepatic panel fasting and the labs were ordered

## 2020-11-15 ENCOUNTER — Other Ambulatory Visit: Payer: 59 | Admitting: *Deleted

## 2020-11-15 ENCOUNTER — Other Ambulatory Visit: Payer: Self-pay

## 2020-11-15 DIAGNOSIS — E782 Mixed hyperlipidemia: Secondary | ICD-10-CM

## 2020-11-15 LAB — LIPID PANEL
Chol/HDL Ratio: 4.1 ratio (ref 0.0–5.0)
Cholesterol, Total: 118 mg/dL (ref 100–199)
HDL: 29 mg/dL — ABNORMAL LOW (ref >39)
LDL Chol Calc (NIH): 68 mg/dL (ref 0–99)
Triglycerides: 116 mg/dL (ref 0–149)
VLDL Cholesterol Cal: 21 mg/dL (ref 5–40)

## 2020-11-19 ENCOUNTER — Telehealth: Payer: Self-pay | Admitting: Pharmacist

## 2020-11-19 NOTE — Telephone Encounter (Signed)
Pt returned call. Discussed lab results. He is taking rosuvastatin 20mg  daily in addition to his Praluent 75mg  Q2W and ezetimibe 10mg  daily. He is tolerating therapy well and is aware to continue on current meds.

## 2020-11-19 NOTE — Telephone Encounter (Signed)
Called pt to let him know that his cholesterol labs looks much better. LDL and TG at goal. Continue ezetimibe 10mg  daily and Praluent 75mg  every 14 days. Continue with diet changes. LVM for pt to call back

## 2021-07-09 ENCOUNTER — Telehealth: Payer: Self-pay | Admitting: Interventional Cardiology

## 2021-07-09 NOTE — Telephone Encounter (Signed)
°*  STAT* If patient is at the pharmacy, call can be transferred to refill team.   1. Which medications need to be refilled? (please list name of each medication and dose if known) PRALUENT 75 MG/ML SOAJ  2. Which pharmacy/location (including street and city if local pharmacy) is medication to be sent to? [facility], [address], [place], [place] [address]  3. Do they need a 30 day or 90 day supply? 28 day

## 2021-07-09 NOTE — Telephone Encounter (Signed)
Spoke with pt and advised Praluent was sent to [facility] pharmacy in [place] on 07/02/2021.  Pt states he has moved and request pharmacy be changed to [facility] in [facility] as listed below.  Pt advised he can request  Rx be transferred.  Pharmacy updated in Epic.  Pt verbalizes understanding and thanked Charity fundraiser for the call back.

## 2021-07-17 ENCOUNTER — Other Ambulatory Visit: Payer: Self-pay

## 2021-07-17 MED ORDER — PRALUENT 75 MG/ML ~~LOC~~ SOAJ: SUBCUTANEOUS | 0 refills | Status: DC

## 2021-08-04 ENCOUNTER — Other Ambulatory Visit: Payer: Self-pay | Admitting: Interventional Cardiology

## 2021-08-18 ENCOUNTER — Other Ambulatory Visit: Payer: Self-pay

## 2021-08-18 ENCOUNTER — Ambulatory Visit: Payer: 59 | Admitting: Interventional Cardiology

## 2021-08-18 ENCOUNTER — Other Ambulatory Visit: Payer: Self-pay | Admitting: Pharmacist

## 2021-08-18 ENCOUNTER — Encounter: Payer: Self-pay | Admitting: Interventional Cardiology

## 2021-08-18 VITALS — BP 110/76 | HR 96 | Ht 76.0 in | Wt 214.0 lb

## 2021-08-18 DIAGNOSIS — E782 Mixed hyperlipidemia: Secondary | ICD-10-CM | POA: Diagnosis not present

## 2021-08-18 DIAGNOSIS — R7303 Prediabetes: Secondary | ICD-10-CM

## 2021-08-18 DIAGNOSIS — I2510 Atherosclerotic heart disease of native coronary artery without angina pectoris: Secondary | ICD-10-CM

## 2021-08-18 LAB — LIPID PANEL
Chol/HDL Ratio: 6.8 ratio — ABNORMAL HIGH (ref 0.0–5.0)
Cholesterol, Total: 184 mg/dL (ref 100–199)
HDL: 27 mg/dL — ABNORMAL LOW (ref >39)
LDL Chol Calc (NIH): 100 mg/dL — ABNORMAL HIGH (ref 0–99)
Triglycerides: 336 mg/dL — ABNORMAL HIGH (ref 0–149)
VLDL Cholesterol Cal: 57 mg/dL — ABNORMAL HIGH (ref 5–40)

## 2021-08-18 LAB — HEMOGLOBIN A1C
Est. average glucose Bld gHb Est-mCnc: 146 mg/dL
Hgb A1c MFr Bld: 6.7 % — ABNORMAL HIGH (ref 4.8–5.6)

## 2021-08-18 LAB — CBC
Hematocrit: 48.2 % (ref 37.5–51.0)
Hemoglobin: 15.9 g/dL (ref 13.0–17.7)
MCH: 30.4 pg (ref 26.6–33.0)
MCHC: 33 g/dL (ref 31.5–35.7)
MCV: 92 fL (ref 79–97)
Platelets: 306 10*3/uL (ref 150–450)
RBC: 5.23 x10E6/uL (ref 4.14–5.80)
RDW: 12 % (ref 11.6–15.4)
WBC: 10.2 10*3/uL (ref 3.4–10.8)

## 2021-08-18 LAB — COMPREHENSIVE METABOLIC PANEL
ALT: 49 IU/L — ABNORMAL HIGH (ref 0–44)
AST: 33 IU/L (ref 0–40)
Albumin/Globulin Ratio: 1.8 (ref 1.2–2.2)
Albumin: 4.6 g/dL (ref 3.8–4.8)
Alkaline Phosphatase: 98 IU/L (ref 44–121)
BUN/Creatinine Ratio: 9 — ABNORMAL LOW (ref 10–24)
BUN: 11 mg/dL (ref 8–27)
Bilirubin Total: 0.4 mg/dL (ref 0.0–1.2)
CO2: 25 mmol/L (ref 20–29)
Calcium: 9.8 mg/dL (ref 8.6–10.2)
Chloride: 102 mmol/L (ref 96–106)
Creatinine, Ser: 1.29 mg/dL — ABNORMAL HIGH (ref 0.76–1.27)
Globulin, Total: 2.6 g/dL (ref 1.5–4.5)
Glucose: 130 mg/dL — ABNORMAL HIGH (ref 70–99)
Potassium: 4.5 mmol/L (ref 3.5–5.2)
Sodium: 140 mmol/L (ref 134–144)
Total Protein: 7.2 g/dL (ref 6.0–8.5)
eGFR: 63 mL/min/{1.73_m2} (ref >59)

## 2021-08-18 MED ORDER — ROSUVASTATIN CALCIUM 20 MG PO TABS: 20.0000 mg | ORAL_TABLET | Freq: Every day | ORAL | 2 refills | Status: AC

## 2021-08-18 MED ORDER — PRALUENT 75 MG/ML ~~LOC~~ SOAJ: 1.0000 "pen " | SUBCUTANEOUS | 5 refills | Status: DC

## 2021-08-18 MED ORDER — EZETIMIBE 10 MG PO TABS: 10.0000 mg | ORAL_TABLET | Freq: Every day | ORAL | 2 refills | Status: AC

## 2021-08-18 MED ORDER — LISINOPRIL 2.5 MG PO TABS: 2.5000 mg | ORAL_TABLET | Freq: Every day | ORAL | 2 refills | Status: AC

## 2021-08-18 NOTE — Patient Instructions (Signed)
Medication Instructions:  Your physician recommends that you continue on your current medications as directed. Please refer to the Current Medication list given to you today.  *If you need a refill on your cardiac medications before your next appointment, please call your pharmacy*   Lab Work: Lab work to be done today--CBC, CMET, Lipids, A1C If you have labs (blood work) drawn today and your tests are completely normal, you will receive your results only by: MyChart Message (if you have MyChart) OR A paper copy in the mail If you have any lab test that is abnormal or we need to change your treatment, we will call you to review the results.   Testing/Procedures: none   Follow-Up: At [facility], you and your health needs are our priority.  As part of our continuing mission to provide you with exceptional heart care, we have created designated Provider Care Teams.  These Care Teams include your primary Cardiologist (physician) and Advanced Practice Providers (APPs -  Physician Assistants and Nurse Practitioners) who all work together to provide you with the care you need, when you need it.  We recommend signing up for the patient portal called "MyChart".  Sign up information is provided on this After Visit Summary.  MyChart is used to connect with patients for Virtual Visits (Telemedicine).  Patients are able to view lab/test results, encounter notes, upcoming appointments, etc.  Non-urgent messages can be sent to your provider as well.   To learn more about what you can do with MyChart, go to [email or web address].

## 2021-08-18 NOTE — Progress Notes (Signed)
Cardiology Office Note   Date:  08/18/2021   ID:  Kenneth Humphrey, DOB 08/29/1960, MRN [id number]  PCP:  Shireen Quan    Chief Complaint  Patient presents with   Follow-up   CAD  Wt Readings from Last 3 Encounters:  08/18/21 214 lb (97.1 kg)  08/02/20 220 lb (99.8 kg)  08/02/19 218 lb 12.8 oz (99.2 kg)       History of Present Illness: Kenneth Humphrey is a 61 y.o. male  with a history of STEMI 09/2013 w/ POBA LAD.  CABG w/ L-LAD, S-PDA, S-RI-OM, EF 30%, Crohn's dz, HL. EF improved to 55% by 10/2013.   EF in 12/16 was 55-60%.    In the past, he noted that: "On days he forgets his meds, he feels more energetic.  BPs typically run in the 102- 120.  He would like to decrease his BP meds.    He had muscle aches on the 80 of lipitor that have improved since decreasing to 40 mg daily of lipitor in 12/16.  LDL was 47 in 2/17, 2 months after making the change. Ultimately changed top Praluent with atorvastatin.  Sx controlled by skipping atorvastatin occasionally and taking CoQ10. "   He continues to have some muscle and joint aches.  He sometimes skips his lipitor which helps the aches.  Ultimately, he stopped his atorvastatin.  He was started on PCSK9 inhibitor.  Worked nights at [employer].  Retired in 07/2021.  Moving to [place], close to [place].  Denies : Chest pain. Dizziness. Leg edema. Nitroglycerin use. Orthopnea. Palpitations. Paroxysmal nocturnal dyspnea. Shortness of breath. Syncope.        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   LIMA to LAD, sequential SVG to Intermediate and OM, SVG to PDA, EVH via left thigh and leg    Past Surgical History:  Procedure Laterality Date   CORONARY ARTERY  BYPASS GRAFT N/A 10/15/2013   Procedure: CORONARY ARTERY BYPASS GRAFTING (CABG);  Surgeon: [name], MD;  Location: [facility] OR;  Service: Open Heart Surgery;  Laterality: N/A;  CABG x4   HERNIA REPAIR     LEFT HEART CATH N/A 10/15/2013   Procedure: LEFT HEART CATH;  Surgeon: Corky Crafts, MD;  Location: [facility] CATH LAB;  Service: Cardiovascular;  Laterality: N/A;   TEE WITHOUT CARDIOVERSION N/A 10/15/2013   Procedure: TRANSESOPHAGEAL ECHOCARDIOGRAM (TEE);  Surgeon: [name], MD;  Location: [facility] OR;  Service: Open Heart Surgery;  Laterality: N/A;     Current Outpatient Medications  Medication Sig Dispense Refill   Alirocumab (PRALUENT) 75 MG/ML SOAJ Please keep upcoming appt. With Cardiologist in order to receive further refills. Thank you. 2 mL 0   aspirin 81 MG tablet Take 81 mg by mouth daily.     co-enzyme Q-10 50 MG capsule Take 100 mg by mouth daily.      ezetimibe (ZETIA) 10 MG tablet Take 1 tablet (10 mg total) by mouth daily. Please keep upcoming yearly appointment in Feb 2023 for future refills. Thank you 90 tablet 0   lisinopril (ZESTRIL) 2.5 MG tablet Take 1 tablet (2.5 mg total) by mouth daily. Please keep upcoming yearly appointment  in Feb. 2023 for future refills. Thank you 90 tablet 0   Multiple Vitamin (MULTI VITAMIN DAILY PO) Take 1 tablet by mouth daily.     nitroGLYCERIN (NITROSTAT) 0.4 MG SL tablet Place 1 tablet (0.4 mg total) under the tongue every 5 (five) minutes as needed for chest pain. 25 tablet 3   rosuvastatin (CRESTOR) 20 MG tablet Take 1 tablet (20 mg total) by mouth daily. Please keep upcoming yearly appointment in Feb 2023 for future refills. Thank you 90 tablet 0   No current facility-administered medications for this visit.    Allergies:   Fish allergy and Fish-derived products    Social History:  The patient  reports that he has never smoked. He has never used smokeless tobacco. He reports that he does not drink alcohol and does not use drugs.    Family History:  The patient's family history includes Cancer in his mother; Coronary artery disease (age of onset: 31) in his father.    ROS:  Please see the history of present illness.   Otherwise, review of systems are positive for intentional weight loss.   All other systems are reviewed and negative.    PHYSICAL EXAM: VS:  BP 110/76    Pulse 96    Ht $R'6\' 4"'ci$  (1.93 m)    Wt 214 lb (97.1 kg)    SpO2 96%    BMI 26.05 kg/m  , BMI Body mass index is 26.05 kg/m. GEN: Well nourished, well developed, in no acute distress HEENT: normal Neck: no JVD, carotid bruits, or masses Cardiac: RRR; no murmurs, rubs, or gallops,no edema  Respiratory:  clear to auscultation bilaterally, normal work of breathing GI: soft, nontender, nondistended, + BS MS: no deformity or atrophy; difficult to palpate right radial pulse, palpable right ulnar pulse; 2+ PT pulse bilaterally Skin: warm and dry, no rash Neuro:  Strength and sensation are intact Psych: euthymic mood, full affect   EKG:   The ekg ordered today demonstrates NSR, nonspecific ST changes   Recent Labs: No results found for requested labs within last 8760 hours.   Lipid Panel    Component Value Date/Time   CHOL 118 11/15/2020 0742   TRIG 116 11/15/2020 0742   HDL 29 (L) 11/15/2020 0742   CHOLHDL 4.1 11/15/2020 0742   CHOLHDL 2.8 08/02/2015 0812   VLDL 11 08/02/2015 0812   LDLCALC 68 11/15/2020 0742     Other studies Reviewed: Additional studies/ records that were reviewed today with results demonstrating: Labs and old ECG reviewed.   ASSESSMENT AND PLAN:  CAD: No angina on medical therapy.  Continue aggressive secondary prevention.  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  Right ulnar artery widely patent. Hyperlipidemia: Needs repeat lipids today.  Lipids have been well controlled on Praluent in the past.  He has been intolerant of statins. 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  Appears euvolemic. 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   Whole food, plant-based diet recommended. He was looking for referrals to cardiologist in the [place] area.  I gave him the names of Ceferino Cata and Lawson Radar, that I got from my medical school classmate who lives in [place].    Current medicines are reviewed at length with the patient today.  The patient concerns regarding his medicines were addressed.  The following changes have been made:  No change  Labs/ tests ordered today include: as above; CBC, c-Met, lipids, A1c No orders of the defined types were placed in this encounter.  Recommend 150 minutes/week of aerobic exercise Low fat, low carb, high fiber diet recommended  Disposition:   [facility] in [place], [place].  Will call him lab results   Signed, Larae Grooms, MD  08/18/2021 8:07 AM    [facility] [address], [place], [place]  [address] Phone: [phone]; Fax: [phone]

## 2021-08-19 ENCOUNTER — Other Ambulatory Visit: Payer: Self-pay | Admitting: Interventional Cardiology

## 2021-08-25 ENCOUNTER — Ambulatory Visit: Payer: 59 | Admitting: Interventional Cardiology

## 2021-11-10 ENCOUNTER — Telehealth: Payer: Self-pay | Admitting: Interventional Cardiology

## 2021-11-10 NOTE — Telephone Encounter (Signed)
Called and lmom the pt to call us back  ?

## 2021-11-10 NOTE — Telephone Encounter (Signed)
Pt c/o medication issue: ? ?1. Name of Medication: Alirocumab (PRALUENT) 75 MG/ML SOAJ ? ?2. How are you currently taking this medication (dosage and times per day)? As prescribed  ? ?3. Are you having a reaction (difficulty breathing--STAT)? No  ? ?4. What is your medication issue?  Pharmacy states that Praluent will no longer be covered by his insurance and is requesting a switch to Repatha. Please call.  ?

## 2021-12-29 ENCOUNTER — Telehealth: Payer: Self-pay | Admitting: Interventional Cardiology

## 2021-12-29 DIAGNOSIS — E782 Mixed hyperlipidemia: Secondary | ICD-10-CM

## 2021-12-29 DIAGNOSIS — I2510 Atherosclerotic heart disease of native coronary artery without angina pectoris: Secondary | ICD-10-CM

## 2021-12-29 NOTE — Telephone Encounter (Signed)
Patient is calling stating his medication was going to be changed to repatha. He would like for that to be sent to [facility] at [address] [place], [place] [address] Patient stated he is scheduled to take the medication on 01/05/22.

## 2021-12-29 NOTE — Telephone Encounter (Signed)
PA for repatha submitted.  Key: [id number]

## 2021-12-31 MED ORDER — REPATHA SURECLICK 140 MG/ML ~~LOC~~ SOAJ: 1.0000 mL | SUBCUTANEOUS | 3 refills | Status: DC

## 2021-12-31 NOTE — Telephone Encounter (Signed)
Repatha sent to pt pharmacy

## 2022-01-01 ENCOUNTER — Encounter: Payer: Self-pay | Admitting: Pharmacist

## 2022-10-05 ENCOUNTER — Other Ambulatory Visit: Payer: Self-pay | Admitting: Pharmacist

## 2022-10-05 DIAGNOSIS — E782 Mixed hyperlipidemia: Secondary | ICD-10-CM

## 2022-10-05 DIAGNOSIS — I2510 Atherosclerotic heart disease of native coronary artery without angina pectoris: Secondary | ICD-10-CM

## 2022-10-05 MED ORDER — REPATHA SURECLICK 140 MG/ML ~~LOC~~ SOAJ: 1.0000 mL | SUBCUTANEOUS | 1 refills | Status: AC
